# Patient Record
Sex: Male | Born: 1960 | Race: White | Hispanic: No | Marital: Married | Smoking: Current every day smoker
Health system: Southern US, Community
[De-identification: ages and names within clinical notes are randomized; demographics above are authoritative.]

## PROBLEM LIST (undated history)

## (undated) DIAGNOSIS — I10 Essential (primary) hypertension: Secondary | ICD-10-CM

## (undated) DIAGNOSIS — C61 Malignant neoplasm of prostate: Secondary | ICD-10-CM

## (undated) DIAGNOSIS — E785 Hyperlipidemia, unspecified: Secondary | ICD-10-CM

## (undated) HISTORY — DX: Hyperlipidemia, unspecified: E78.5

## (undated) HISTORY — PX: PROSTATE BIOPSY: SHX241

## (undated) HISTORY — DX: Essential (primary) hypertension: I10

## (undated) HISTORY — PX: TONSILLECTOMY: SUR1361

---

## 2016-08-30 ENCOUNTER — Other Ambulatory Visit: Payer: Self-pay | Admitting: Family Medicine

## 2016-08-30 ENCOUNTER — Ambulatory Visit
Admission: RE | Admit: 2016-08-30 | Discharge: 2016-08-30 | Disposition: A | Discharge status: Home / Self Care | Payer: BC Managed Care – PPO | Source: Ambulatory Visit | Attending: Family Medicine | Admitting: Family Medicine

## 2016-08-30 DIAGNOSIS — R519 Headache, unspecified: Secondary | ICD-10-CM

## 2016-08-30 DIAGNOSIS — R51 Headache: Principal | ICD-10-CM

## 2016-08-30 DIAGNOSIS — G4489 Other headache syndrome: Secondary | ICD-10-CM

## 2016-09-05 ENCOUNTER — Encounter: Payer: Self-pay | Admitting: Neurology

## 2016-09-05 ENCOUNTER — Ambulatory Visit (INDEPENDENT_AMBULATORY_CARE_PROVIDER_SITE_OTHER): Payer: BC Managed Care – PPO | Admitting: Neurology

## 2016-09-05 VITALS — BP 136/88 | HR 94 | Ht 70.0 in | Wt 172.0 lb

## 2016-09-05 DIAGNOSIS — G902 Horner's syndrome: Secondary | ICD-10-CM

## 2016-09-05 DIAGNOSIS — R51 Headache: Secondary | ICD-10-CM | POA: Diagnosis not present

## 2016-09-05 DIAGNOSIS — R519 Headache, unspecified: Secondary | ICD-10-CM

## 2016-09-05 DIAGNOSIS — Z8249 Family history of ischemic heart disease and other diseases of the circulatory system: Secondary | ICD-10-CM

## 2016-09-05 DIAGNOSIS — G44009 Cluster headache syndrome, unspecified, not intractable: Secondary | ICD-10-CM

## 2016-09-05 MED ORDER — VERAPAMIL HCL 80 MG PO TABS: 80.0000 mg | ORAL_TABLET | Freq: Three times a day (TID) | ORAL | 2 refills | Status: DC

## 2016-09-05 NOTE — Patient Instructions (Addendum)
1.  We will start verapamil 80mg  three times daily.  Contact me in 2 weeks with update and we can increase dose if needed.  Caution for dizziness or lightheadedness. 2.  Will get MRI of brain and  MRA of head and  Neck 3.  Once I review the imaging, I will prescribe you a medication to take when you get the headache. 4.  Follow up in 3 months but contact me in 2 weeks if headaches are not improved.

## 2016-09-05 NOTE — Progress Notes (Signed)
Chart forwarded.  

## 2016-09-05 NOTE — Progress Notes (Signed)
NEUROLOGY CONSULTATION NOTE  Dan Farrell MRN: [id number] DOB: 1961-04-14  Referring provider: Dr. [name] Primary care provider: Dr. [name]  Reason for consult:  headache  HISTORY OF PRESENT ILLNESS: Dan Farrell is a 55 year old right-handed man with hypertension, hypercholesterolemia, gout and Peyronie's disease who presents for headache.  He is accompanied by his wife who supplements history.  He began having these new headaches 3 1/2 weeks ago.  They are right sided, stabbing, 9/10 intensity and associated with droopy right eyelid, photophobia, and sensation of congestion in his right maxillary sinus.  They usually last around 20 minutes but he may have a right sided pressure headache lasting longer (on a couple of occasions, lasting all day).  It typically wakes him up from sleep between 5 and 7 AM.  He needs to get up, walk around, drink water and massage his head until it resolves.  He denies triggers.  Alcohol does not affect it.  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  Usually goes to bed at 4 AM.  Usually gets 4 hours of sleep a night. Has history of "sinus headaches" over 20 years ago Family history of headache:  Brother had headaches, intracranial aneurysm, stroke and seizures.  CT of head from 08/30/16 showed mild chronic small vessel ischemic changes but otherwise unremarkable.  No evidence of sinusitis. Labs from 08/31/16 include sed rate 4.  PAST MEDICAL HISTORY: Past Medical History:  Diagnosis Date    . Hyperlipemia   . Hypertension     PAST SURGICAL HISTORY: Past Surgical History:  Procedure Laterality Date  . TONSILLECTOMY      MEDICATIONS: No current outpatient prescriptions on file prior to visit.   No current facility-administered medications on file prior to visit.     ALLERGIES: No Known Allergies  FAMILY HISTORY: Family History  Problem Relation Age of Onset  . Seizures Brother   . Aneurysm Brother   . Stroke Brother     SOCIAL HISTORY: Social History   Social History  . Marital status: Unknown    Spouse name: N/A  . Number of children: N/A  . Years of education: N/A   Occupational History  . Not on file.   Social History Main Topics  . Smoking status: Smoker, Current Status Unknown  . Smokeless tobacco: Not on file  . Alcohol use Not on file  . Drug use: Unknown  . Sexual activity: Not on file   Other Topics Concern  . Not on file   Social History Narrative  . No narrative on file    REVIEW OF SYSTEMS: Constitutional: No fevers, chills, or sweats, no generalized fatigue, change in appetite Eyes: No visual changes, double vision, eye pain Ear, nose and throat: No hearing loss, ear pain, nasal congestion, sore throat Cardiovascular: No chest pain, palpitations Respiratory:  No shortness of breath at rest or with exertion, wheezes GastrointestinaI: No nausea, vomiting, diarrhea, abdominal pain, fecal incontinence Genitourinary:  No dysuria, urinary retention or frequency Musculoskeletal:  No neck pain, back pain Integumentary: No rash, pruritus, skin lesions Neurological: as above Psychiatric: No depression, insomnia, anxiety Endocrine: No palpitations,  fatigue, diaphoresis, mood swings, change in appetite, change in weight, increased thirst Hematologic/Lymphatic:  No purpura, petechiae. Allergic/Immunologic: no itchy/runny eyes, nasal congestion, recent allergic reactions, rashes  PHYSICAL EXAM: Vitals:   09/05/16 1430  BP: 136/88   Pulse: 94   General: No acute distress.  Patient appears well-groomed.  Head:  Normocephalic/atraumatic Eyes:  fundi examined but not visualized Neck: supple, no paraspinal tenderness, full range of motion Back: No paraspinal tenderness Heart: regular rate and rhythm Lungs: Clear to auscultation bilaterally. Vascular: No carotid bruits. Neurological Exam: Mental status: alert and oriented to person, place, and time, recent and remote memory intact, fund of knowledge intact, attention and concentration intact, speech fluent and not dysarthric, language intact. Cranial nerves: CN I: not tested CN II: pupil OD 2.5 mm, OS 4 mm, round and reactive to light, visual fields intact CN III, IV, VI:  full range of motion, no nystagmus, right ptosis CN V: facial sensation intact CN VII: upper and lower face symmetric CN VIII: hearing intact CN IX, X: gag intact, uvula midline CN XI: sternocleidomastoid and trapezius muscles intact CN XII: tongue midline Bulk & Tone: normal, no fasciculations. Motor:  5/5 throughout  Sensation: temperature and vibration sensation intact. Deep Tendon Reflexes:  2+ throughout, toes downgoing.  Finger to nose testing:  Without dysmetria.  Heel to shin:  Without dysmetria.  Gait:  Normal station and stride.  Able to turn and tandem walk. Romberg negative.  IMPRESSION: Suspect cluster headaches.  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  However, in new headache in patient over 50, other causes must be ruled out.  PLAN: 1.  MRI of brain and MRA of head and neck to evaluate for intracranial aneurysm (brother had it and required surgery) and possible causes of Horner's syndrome. 2.  Initiate verapamil 80mg  three times daily.  Advised to monitor for lightheadedness. 3.  Once vascular causes are ruled out, will prescribe sumatriptan for abortive therapy. 4.  Follow up in 3 months but he is to contact me in 2 weeks with update and we  can increase dose of verapamil if needed.  Thank you for allowing me to take part in the care of this patient.  Metta Clines, DO  CC:  [name], MD

## 2016-09-19 ENCOUNTER — Ambulatory Visit
Admission: RE | Admit: 2016-09-19 | Discharge: 2016-09-19 | Disposition: A | Discharge status: Home / Self Care | Payer: BC Managed Care – PPO | Source: Ambulatory Visit | Attending: Neurology | Admitting: Neurology

## 2016-09-19 ENCOUNTER — Ambulatory Visit: Payer: BC Managed Care – PPO | Admitting: Neurology

## 2016-09-19 DIAGNOSIS — G902 Horner's syndrome: Secondary | ICD-10-CM

## 2016-09-19 DIAGNOSIS — R51 Headache: Secondary | ICD-10-CM

## 2016-09-19 DIAGNOSIS — Z8249 Family history of ischemic heart disease and other diseases of the circulatory system: Secondary | ICD-10-CM

## 2016-09-19 DIAGNOSIS — R519 Headache, unspecified: Secondary | ICD-10-CM

## 2016-09-19 MED ORDER — GADOBENATE DIMEGLUMINE 529 MG/ML IV SOLN
16.0000 mL | Freq: Once | INTRAVENOUS | Status: AC | PRN
  Administered 2016-09-19: 16 mL via INTRAVENOUS

## 2016-09-20 ENCOUNTER — Telehealth: Payer: Self-pay

## 2016-09-20 DIAGNOSIS — G902 Horner's syndrome: Secondary | ICD-10-CM

## 2016-09-20 NOTE — Telephone Encounter (Signed)
Attempted to reach. Line continuously rang. Will attempt againl ater.

## 2016-09-20 NOTE — Telephone Encounter (Signed)
-----   Message from Pieter Partridge, DO sent at 09/20/2016  9:08 AM EST ----- MRIs look okay.  When he gets a headache attack, he may try sumatriptan.  I recommend the 6mg  injection since it is very effective for these headaches (1 injection at earliest onset of headache an may repeat once after 1 hour if needed).  If he would rather not take an injection, we can prescribe him 100mg  tablet (1 tablet at earliest onset of headache, may repeat after 2 hours if needed).  He should not exceed 2 doses of either medication over 24 hours.

## 2016-09-21 ENCOUNTER — Telehealth: Payer: Self-pay | Admitting: Neurology

## 2016-09-21 MED ORDER — SUMATRIPTAN SUCCINATE 3 MG/0.5ML ~~LOC~~ SOAJ: 3.0000 mg | SUBCUTANEOUS | 3 refills | Status: DC

## 2016-09-21 NOTE — Telephone Encounter (Signed)
Right now, I think the headache is cluster headache.  There isn't always a specific cause for it (some people just develop it, like migraine).  The droopy eyelid is likely a symptom of the headache.  However, since it is constant, one last thing I want to check is MRI of chest to see if there is anything pressing on the nerves that affect the eyelid.

## 2016-09-21 NOTE — Telephone Encounter (Signed)
See previous message

## 2016-09-21 NOTE — Telephone Encounter (Signed)
Dan Farrell 03/29/2061. His # [id number]. He had an MRI on 09/19/16 and he was wondering if the results from the MRI were back. Thank you

## 2016-09-21 NOTE — Telephone Encounter (Signed)
Message relayed to patient. Verbalized understanding. RX was sent to pharmacy, pt okay with injectable. Pt did ask what the next step is to find out whats going on as he is still experiencing pressure behind his eye that is causing his eye lid to droop. Please advise.

## 2016-09-21 NOTE — Telephone Encounter (Signed)
Pt wants to hold off on MRI at the moment. Would like to know if eyelid droop could be helped if verapamil was increased? Could it be due to allergies? Please advise.   Pt aware out of office until Monday.

## 2016-09-23 NOTE — Telephone Encounter (Signed)
I think the eyelid droop is related to the headache.  It is unusual for it to be persistent (usually occurs and resolves with headache), however there are cases where  it may be persistent even when not with headache.  That is why I want to check MRI of chest (to make sure there is no other cause).  I really don't know if increasing the verapamil will fix the droopy eyelid.  It is used to prevent the headache.  If the headache is contro lled on current dose of verapamil, then I wouldn't increase it.  If headaches are not still controlled, then we can increase dose after he has been on current dose for 2 weeks.

## 2016-10-09 ENCOUNTER — Telehealth: Payer: Self-pay | Admitting: Neurology

## 2016-10-09 NOTE — Telephone Encounter (Signed)
Spoke with wife, message relayed, Will dicuss with pt tonight. Will call back if he wishes to pursue.

## 2016-10-09 NOTE — Telephone Encounter (Signed)
Wife called to report that patient's headaches are very well controlled. However he is still suffering from the droopy/runny eye. Would like to know when this will return to normal? Is there a treatment? Please advise.

## 2016-10-09 NOTE — Telephone Encounter (Signed)
Patient wife called and would like [place] to call patient about his eye still drooping. Please call him at [phone]

## 2016-10-09 NOTE — Telephone Encounter (Signed)
Normally the eye droop resolves when headache resolves.  However, there are cases where the droopy eyelid remains even without the headache.  However, to completely rule out other causes of the Horner's Syndrome, I wanted to check MRI of the chest (I am looking for anything that could be pinching the nerves that may cause this).  Otherwise, I can refer him to neuro-ophthalmology (Dr. Jolyn Nap) at [facility].  He specializes in neurologic conditions involving the eye.

## 2016-12-02 ENCOUNTER — Other Ambulatory Visit: Payer: Self-pay | Admitting: Neurology

## 2016-12-02 DIAGNOSIS — G44009 Cluster headache syndrome, unspecified, not intractable: Secondary | ICD-10-CM

## 2016-12-26 ENCOUNTER — Ambulatory Visit (INDEPENDENT_AMBULATORY_CARE_PROVIDER_SITE_OTHER): Payer: BC Managed Care – PPO | Admitting: Neurology

## 2016-12-26 ENCOUNTER — Encounter: Payer: Self-pay | Admitting: Neurology

## 2016-12-26 VITALS — BP 122/62 | HR 71 | Ht 70.0 in | Wt 172.3 lb

## 2016-12-26 DIAGNOSIS — G44009 Cluster headache syndrome, unspecified, not intractable: Secondary | ICD-10-CM

## 2016-12-26 DIAGNOSIS — G902 Horner's syndrome: Secondary | ICD-10-CM

## 2016-12-26 NOTE — Patient Instructions (Signed)
1.  Try stopping the verapamil and see how you do.  If headaches return, restart it and let me know 2.  I just want to check a chest Xray to make sure there isn't anything pressing on the nerve to cause the droopy eyelid. 3.  Follow up in 3 months.

## 2016-12-26 NOTE — Progress Notes (Signed)
NEUROLOGY FOLLOW UP OFFICE NOTE  Dan Farrell [id number]  HISTORY OF PRESENT ILLNESS: Dan Farrell is a 56 year old right-handed man with hypertension, hypercholesterolemia, gout and Peyronie's disease who follows up for cluster headache and right Horner's syndrome.  UPDATE: To assess for Horner's syndrome, MRI of brain with and without contrast and MRA of head and neck were performed on 09/19/16, which were personally reviewed and were normal.  He is taking verapamil 80mg  three times daily.  Headaches resolved completely soon after starting it.  Also, he has had changes at work that is less stressful.  He still has the Horner's syndrome and right side of his nose is congested.  He denies double vision or right arm pain or weakness.  He would like to discuss stopping the verapamil.  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  Usually gets 4 hours of sleep a night.  HISTORY: He began having these new headaches 3 1/2 weeks ago.  They are right sided, stabbing, 9/10 intensity and associated with droopy right eyelid, photophobia, and sensation of congestion in his right maxillary sinus.  They usually last around 20 minutes but he may have a right sided pressure headache lasting longer (on a couple of occasions, lasting all day).  It typically wakes him up from sleep between 5 and 7 AM.  He needs to get up, walk around, drink water and massage his head until it resolves.  He denies triggers.  Alcohol does not affect it.    Has history of "sinus headaches" over 20 years ago Family history of headache:  Brother had headaches, intracranial aneurysm, stroke and seizures.   CT of head from 08/30/16 showed mild chronic small vessel ischemic changes but otherwise unremarkable.  No evidence of sinusitis. Labs from 08/31/16 include sed rate 4.  PAST MEDICAL HISTORY: Past Medical History:  Diagnosis Date  . Hyperlipemia   .  Hypertension     MEDICATIONS: Current Outpatient Prescriptions on File Prior to Visit  Medication Sig Dispense Refill  . fenofibrate 160 MG tablet TAKE 1 TABLET BY MOUTH ONCE A DAY FOR CHOLESTEROL  3  . Multiple Vitamin (MULTIVITAMIN) tablet Take 1 tablet by mouth daily.    . naproxen sodium (ANAPROX) 220 MG tablet Take 220 mg by mouth 2 (two) times daily with a meal.    . SUMAtriptan Succinate (ZEMBRACE SYMTOUCH) 3 MG/0.5ML SOAJ Inject 3 mg into the skin as directed. At earliest onset, may repeat in 2 hours if headache persists or reoccurs 4.5 mL 3  . verapamil (CALAN) 80 MG tablet TAKE 1 TABLET (80 MG TOTAL) BY MOUTH 3 (THREE) TIMES DAILY. 90 tablet 2  . valsartan-hydrochlorothiazide (DIOVAN-HCT) 160-12.5 MG tablet Take 1 tablet by mouth daily.  0   No current facility-administered medications on file prior to visit.     ALLERGIES: No Known Allergies  FAMILY HISTORY: Family History  Problem Relation Age of Onset  . Seizures Brother   . Aneurysm Brother   . Stroke Brother     SOCIAL HISTORY: Social History   Social History  . Marital status: Unknown    Spouse name: N/A  . Number of children: N/A  . Years of education: N/A   Occupational History  . Not on file.   Social History Main Topics  . Smoking status: Smoker, Current Status Unknown  . Smokeless tobacco: Never Used  . Alcohol use Not on file  .  Drug use: Unknown  . Sexual activity: Not on file   Other Topics Concern  . Not on file   Social History Narrative  . No narrative on file    REVIEW OF SYSTEMS: Constitutional: No fevers, chills, or sweats, no generalized fatigue, change in appetite Eyes: No visual changes, double vision, eye pain Ear, nose and throat: No hearing loss, ear pain, nasal congestion, sore throat Cardiovascular: No chest pain, palpitations Respiratory:  No shortness of breath at rest or with exertion, wheezes GastrointestinaI: No nausea, vomiting, diarrhea, abdominal pain, fecal  incontinence Genitourinary:  No dysuria, urinary retention or frequency Musculoskeletal:  No neck pain, back pain Integumentary: No rash, pruritus, skin lesions Neurological: as above Psychiatric: No depression, insomnia, anxiety Endocrine: No palpitations, fatigue, diaphoresis, mood swings, change in appetite, change in weight, increased thirst Hematologic/Lymphatic:  No purpura, petechiae. Allergic/Immunologic: no itchy/runny eyes, nasal congestion, recent allergic reactions, rashes  PHYSICAL EXAM: Vitals:   12/26/16 1428  BP: 122/62  Pulse: 71   General: No acute distress.  Patient appears well-groomed.  normal body habitus. Head:  Normocephalic/atraumatic Eyes:  Fundi examined but not visualized Neck: supple, no paraspinal tenderness, full range of motion Heart:  Regular rate and rhythm Lungs:  Clear to auscultation bilaterally Back: No paraspinal tenderness Neurological Exam: alert and oriented to person, place, and time. Attention span and concentration intact, recent and remote memory intact, fund of knowledge intact.  Speech fluent and not dysarthric, language intact.  Right ptosis.  Pupil OD 2.5 mm, OS 4 mm.  Otherwise, CN II-XII intact. Bulk and tone normal, muscle strength 5/5 throughout.  Sensation to light touch, temperature and vibration intact.  Deep tendon reflexes 2+ throughout, toes downgoing.  Finger to nose and heel to shin testing intact.  Gait normal, Romberg negative.  IMPRESSION: Cluster headache, resolved Persistent right Horner's syndrome.  PLAN: 1.  He will try discontinuing verapamil and see how he feels.  If headache returns, he will restart it. 2.  To complete evaluation of sympathetic chain, will check CXR as well. 3.  Follow up in 3 months.  Metta Clines, DO  CC:  [name], MD

## 2017-04-02 ENCOUNTER — Ambulatory Visit: Payer: BC Managed Care – PPO | Admitting: Neurology

## 2017-04-02 DIAGNOSIS — Z029 Encounter for administrative examinations, unspecified: Secondary | ICD-10-CM

## 2017-04-03 ENCOUNTER — Encounter: Payer: Self-pay | Admitting: Neurology

## 2017-05-19 IMAGING — MR MR HEAD WO/W CM
14 of 17 series · 39 of 48 positions shown · IV contrast (16ml multihance)
Comparison: None.

CLINICAL DATA: Sharp stabbing pain on the right side of the head
for the last 2 months. Family history of aneurysm.

Creatinine was obtained on site at [HOSPITAL] at [HOSPITAL].Results: Creatinine 1.2 mg/dL.
EXAM:
MRI HEAD WITHOUT AND WITH CONTRAST
MRA HEAD WITHOUT CONTRAST
MRA NECK WITHOUT AND WITH CONTRAST
TECHNIQUE: Multiplanar, multiecho pulse sequences of the brain and surrounding
structures were obtained without and with intravenous contrast.
Angiographic images of the Circle of Willis were obtained using MRA
technique without intravenous contrast. Angiographic images of the
neck were obtained using MRA technique without and with intravenous
contrast. Carotid stenosis measurements (when applicable) are
obtained utilizing NASCET criteria, using the distal internal
carotid diameter as the denominator.
CONTRAST:  16mL MULTIHANCE GADOBENATE DIMEGLUMINE 529 MG/ML IV SOLN

[Series 2: T1 · sagittal · 5.0mm · 0.45mm/px · 1 of 21 slices shown]
[im 1/21]
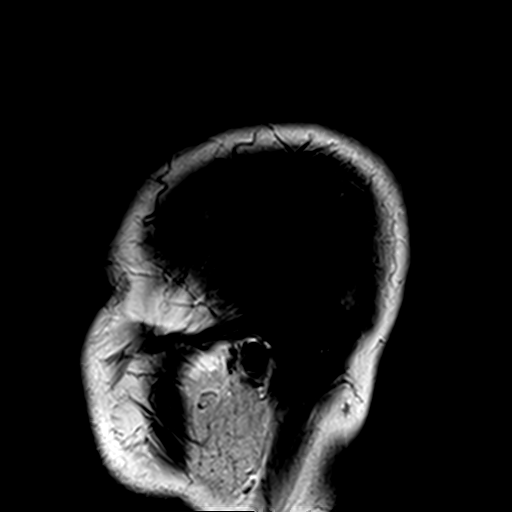

[Series 3: DWI · axial · 3.0mm · 1.80mm/px · z∈[-45,+101]mm · 5 of 100 slices shown (1 of 4)]
[im 1/100]
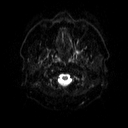
[im 25/100]
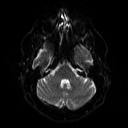
[im 50/100]
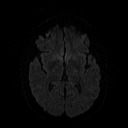
[im 75/100]
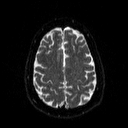
[im 100/100]
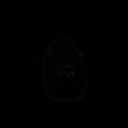

[Series 4: DWI · axial · 3.0mm · 1.80mm/px · z∈[-45,+101]mm · 2 of 50 slices shown (2 of 4)]
[im 1/50]
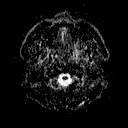
[im 50/50]
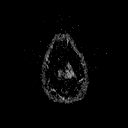

[Series 5: DWI · coronal · 5.0mm · 1.80mm/px · 3 of 66 slices shown (3 of 4)]
[im 1/66]
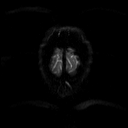
[im 33/66]
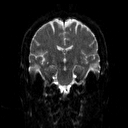
[im 66/66]
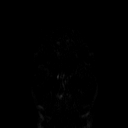

[Series 6: DWI · coronal · 5.0mm · 1.80mm/px · 2 of 34 slices shown (4 of 4)]
[im 1/34]
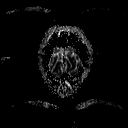
[im 34/34]
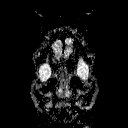

[Series 8: mip_images(sw) · axial · 16.0mm · 0.90mm/px · z∈[-45,+98]mm · 4 of 73 slices shown]
[im 1/73]
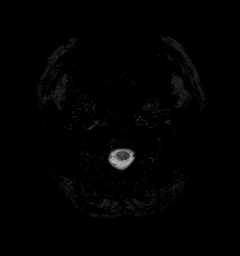
[im 25/73]
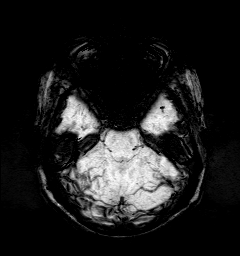
[im 49/73]
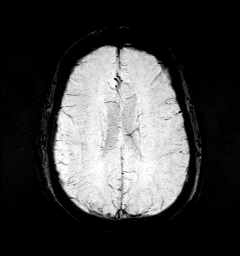
[im 73/73]
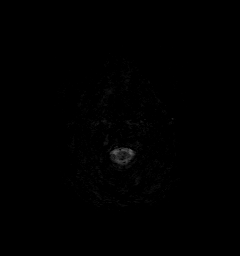

[Series 9: swi_images · axial · 2.0mm · 0.90mm/px · z∈[-52,+105]mm · 4 of 80 slices shown]
[im 1/80]
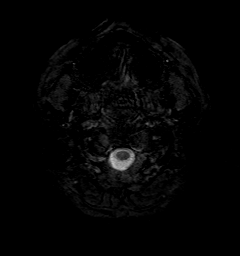
[im 27/80]
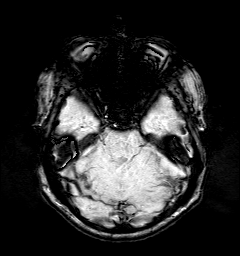
[im 53/80]
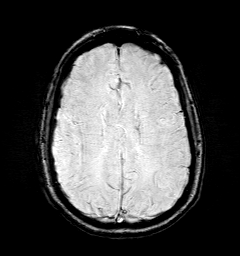
[im 80/80]
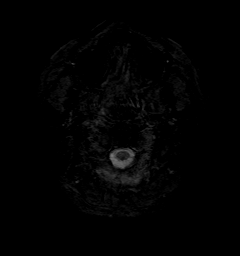

[Series 10: tof_3d_multi-slab · axial · 0.7mm · 0.35mm/px · z∈[-34,+59]mm · 7 of 136 slices shown]
[im 1/136]
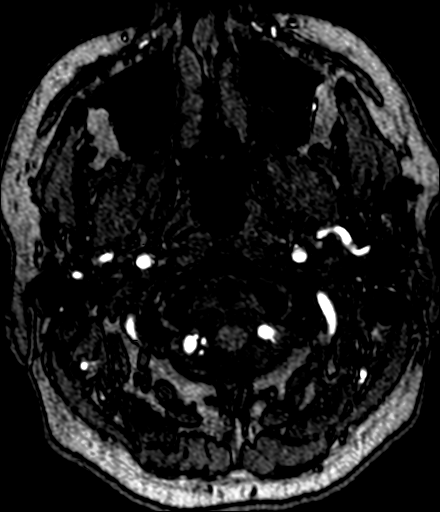
[im 23/136]
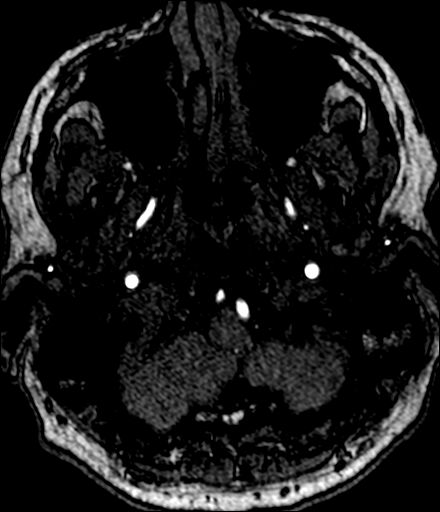
[im 46/136]
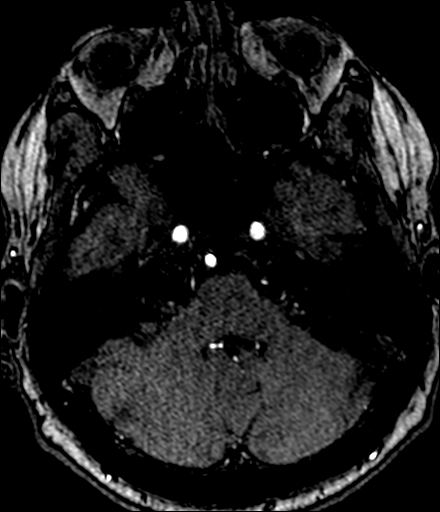
[im 68/136]
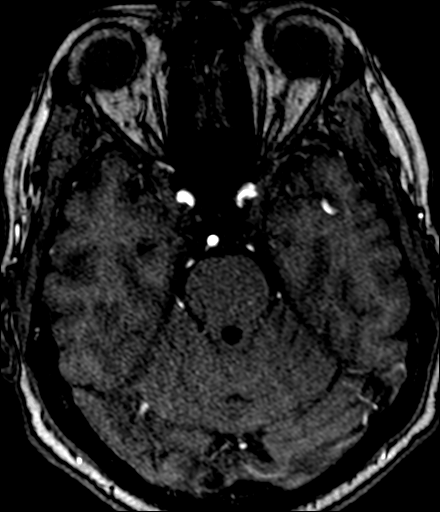
[im 91/136]
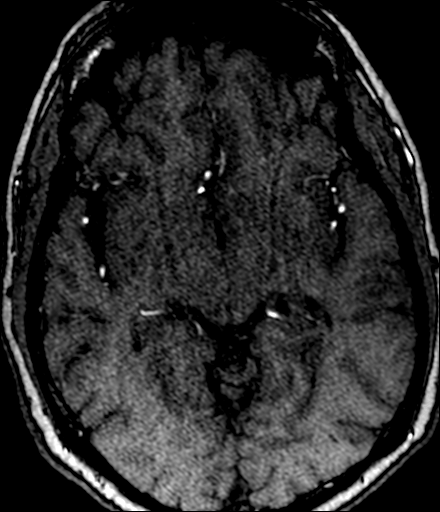
[im 113/136]
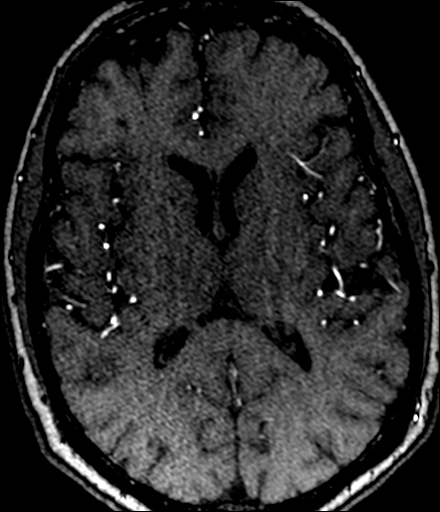
[im 136/136]
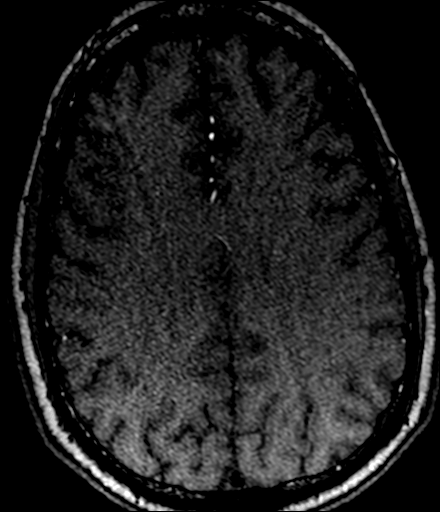

[Series 14: T2 · axial · 5.0mm · 0.51mm/px · 1 of 22 slices shown (1 of 2)]
[im 1/22]
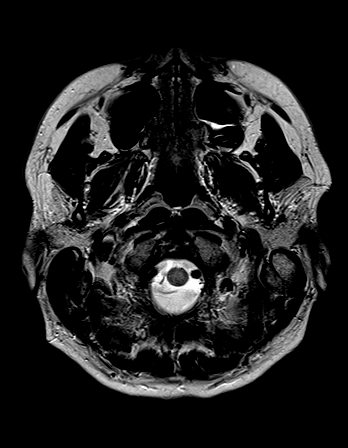

[Series 15: FLAIR · axial · 5.0mm · 0.45mm/px · 1 of 22 slices shown]
[im 1/22]
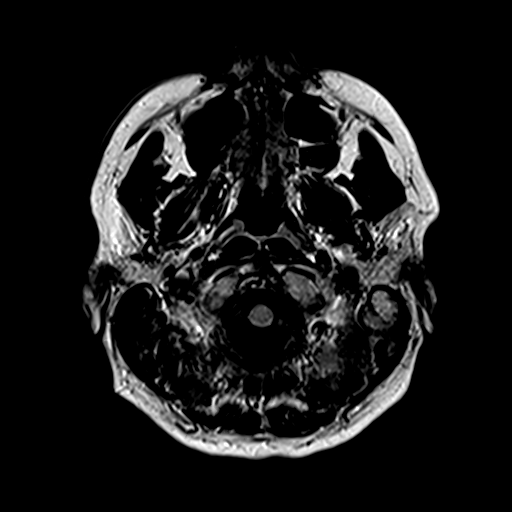

[Series 16: t1_mpr_tra · axial · 2.0mm · 0.45mm/px · z∈[-51,+107]mm · 4 of 80 slices shown]
[im 1/80]
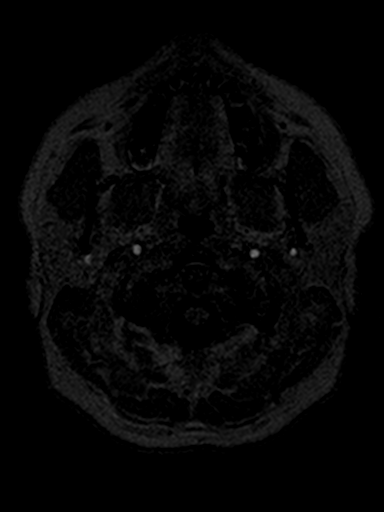
[im 27/80]
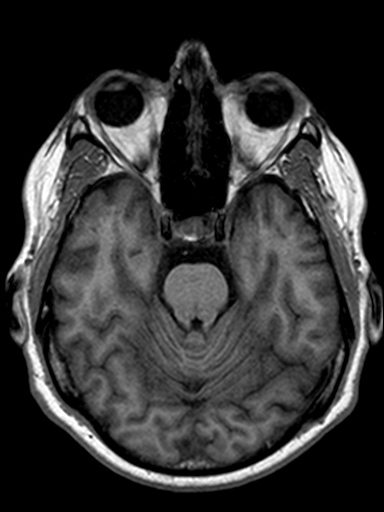
[im 53/80]
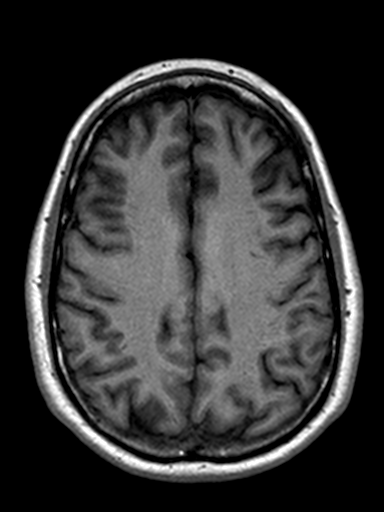
[im 80/80]
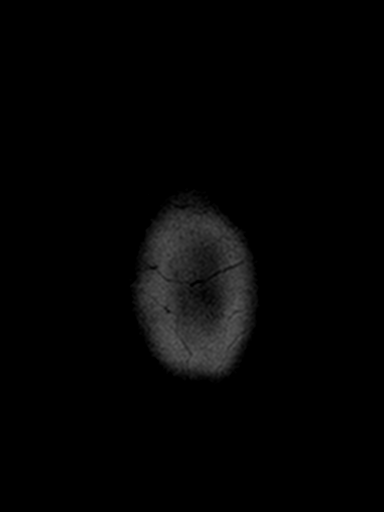

[Series 20: fl_tof_2d · axial · 3.0mm · 0.39mm/px · z∈[-171,-73]mm · 2 of 50 slices shown]
[im 1/50]
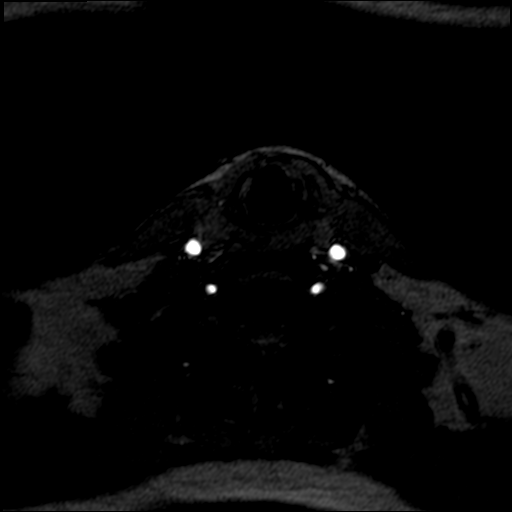
[im 50/50]
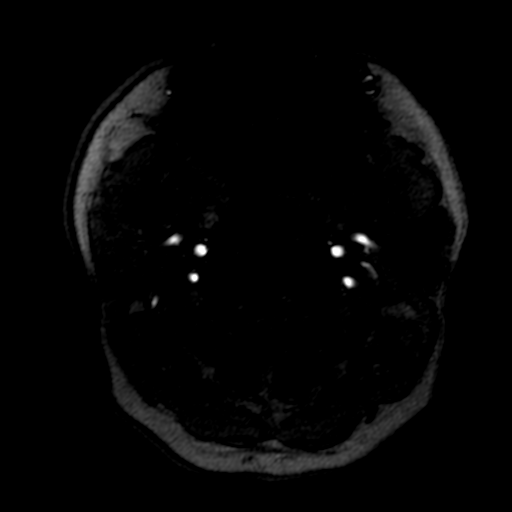

[Series 26: (id)_tt=1.0s · coronal · 0.8mm · 0.78mm/px · 2 of 70 slices shown]
[im 1/70]
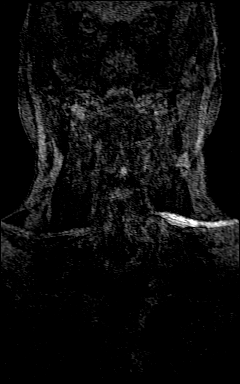
[im 35/70]
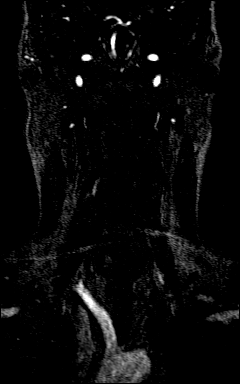

[Series 29: T2 · coronal · 5.0mm · 0.45mm/px · 1 of 25 slices shown (2 of 2)]
[im 1/25]
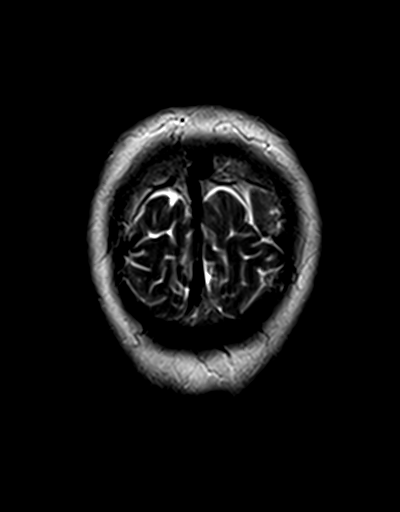

[39 of 48 positions shown; findings below may reference images not displayed]

FINDINGS: MRI HEAD FINDINGS

Brain: The brain has normal appearance without evidence of
malformation, atrophy, old or acute small or large vessel
infarction, hemorrhage, hydrocephalus or extra-axial collection. No
pituitary abnormality. After contrast administration, no abnormal
enhancement occurs.

Vascular: Negative.  See below.

Skull and upper cervical spine: Normal

Sinuses/Orbits: Clear/normal

Other: None significant.

MRA HEAD FINDINGS

Both internal carotid arteries are widely patent into the brain. The
anterior and middle cerebral vessels are normal without proximal
stenosis, aneurysm or vascular malformation. Incidental fenestration
of the A1 segment on the left, not significant. Both vertebral
arteries are widely patent to the basilar. No basilar stenosis.
Posterior circulation branch vessels are normal.

MRA NECK FINDINGS

Branching pattern of the brachiocephalic vessels from the arch is
normal. No origin stenoses. Both common carotid arteries are widely
patent to their respective bifurcation. Both carotid bifurcations
are normal. Both cervical internal carotid arteries are normal. Both
vertebral artery origins are widely patent. Both vertebral arteries
appear normal through the cervical region.
IMPRESSION: Normal MRI of the brain.

Normal MR angiography of the neck vessels.

Normal intracranial MR angiography. No abnormality seen to explain
the patient's symptoms. No aneurysm.

## 2018-03-24 DIAGNOSIS — C61 Malignant neoplasm of prostate: Secondary | ICD-10-CM

## 2018-03-24 HISTORY — DX: Malignant neoplasm of prostate: C61

## 2018-04-01 ENCOUNTER — Other Ambulatory Visit: Payer: Self-pay | Admitting: Urology

## 2018-04-01 DIAGNOSIS — C61 Malignant neoplasm of prostate: Secondary | ICD-10-CM

## 2018-04-02 ENCOUNTER — Other Ambulatory Visit: Payer: Self-pay | Admitting: Urology

## 2018-04-14 ENCOUNTER — Encounter (HOSPITAL_COMMUNITY)
Admission: RE | Admit: 2018-04-14 | Discharge: 2018-04-14 | Disposition: A | Discharge status: Home / Self Care | Payer: BC Managed Care – PPO | Source: Ambulatory Visit | Attending: Urology | Admitting: Urology

## 2018-04-14 DIAGNOSIS — C7951 Secondary malignant neoplasm of bone: Secondary | ICD-10-CM | POA: Insufficient documentation

## 2018-04-14 DIAGNOSIS — C61 Malignant neoplasm of prostate: Secondary | ICD-10-CM | POA: Diagnosis present

## 2018-04-14 MED ORDER — TECHNETIUM TC 99M MEDRONATE IV KIT
20.0000 | PACK | Freq: Once | INTRAVENOUS | Status: AC | PRN
  Administered 2018-04-14: 21.2 via INTRAVENOUS

## 2018-04-22 ENCOUNTER — Encounter: Payer: Self-pay | Admitting: Oncology

## 2018-04-22 ENCOUNTER — Telehealth: Payer: Self-pay | Admitting: Oncology

## 2018-04-22 NOTE — Telephone Encounter (Signed)
New referral received from Dr. Louis Meckel at [facility], dx: metastatic prostate cancer. Pt has been scheduled to see Dr. Alen Blew on 6/20 at 11am. Pt aware to arrive 30 minutes early. Letter mailed.

## 2018-05-01 ENCOUNTER — Telehealth: Payer: Self-pay | Admitting: Pharmacist

## 2018-05-01 ENCOUNTER — Telehealth: Payer: Self-pay | Admitting: Pharmacy Technician

## 2018-05-01 ENCOUNTER — Telehealth: Payer: Self-pay | Admitting: Oncology

## 2018-05-01 ENCOUNTER — Inpatient Hospital Stay: Payer: BC Managed Care – PPO | Attending: Oncology | Admitting: Oncology

## 2018-05-01 ENCOUNTER — Encounter: Payer: Self-pay | Admitting: Medical Oncology

## 2018-05-01 VITALS — BP 131/83 | HR 68 | Temp 98.0°F | Resp 18 | Ht 70.0 in | Wt 171.2 lb

## 2018-05-01 DIAGNOSIS — C61 Malignant neoplasm of prostate: Secondary | ICD-10-CM | POA: Diagnosis not present

## 2018-05-01 MED ORDER — ABIRATERONE ACETATE 250 MG PO TABS: 1000.0000 mg | ORAL_TABLET | Freq: Every day | ORAL | 0 refills | Status: DC

## 2018-05-01 MED ORDER — PREDNISONE 5 MG PO TABS: 5.0000 mg | ORAL_TABLET | Freq: Every day | ORAL | 3 refills | Status: DC

## 2018-05-01 NOTE — Progress Notes (Signed)
Reason for Referral: Prostate cancer  HPI: 57 year old man currently of [place] where he lived the majority of his life.  He is a rather healthy man with history of hypertension and hyperlipidemia was found to have an elevated PSA in September 2018 at that time his PSA was 5.59.  A repeat PSA in March 2019 was up to 11.0.  He subsequently evaluated by Dr. Louis Meckel and a biopsy of his prostate obtained on Mar 24, 2018.  At that time he was noted to have a Gleason score 4+3 equal 7 and 5 cores with one core of 4+4 = 8 all predominantly on the left side of the gland.  Based in these findings he underwent staging work-up including a bone scan obtained on 04/14/2018 which showed evidence of osseous metastatic disease in the left inferior pubic ramus as well as the left femur lesser trochanter and to posterior ribs that are highly suspicious for bony metastasis.  CT scan of the abdomen and pelvis showed a 10 mm sclerotic lesion in the left ischial tuberosity with small left iliac lymph node measuring less than 1 cm suspicious for bony metastatic disease.  Based on these findings, he was referred to me for evaluation.  Clinically, he reports no symptoms related to these findings.  He denies any lower urinary tract symptoms including frequency, urgency or hesitancy.  He denies any erectile dysfunction or bone pain.  He denies any pathological fractures or discomfort.  He denies any excessive fatigue or tiredness.  He remains active and continues to work full-time.   does not report any headaches, blurry vision, syncope or seizures. Does not report any fevers, chills or sweats.  Does not report any cough, wheezing or hemoptysis.  Does not report any chest pain, palpitation, orthopnea or leg edema.  Does not report any nausea, vomiting or abdominal pain.  Does not report any constipation or diarrhea.  Does not report any skeletal complaints.    Does not report frequency, urgency or hematuria.  Does not report any skin  rashes or lesions. Does not report any heat or cold intolerance.  Does not report any lymphadenopathy or petechiae.  Does not report any anxiety or depression.  Remaining review of systems is negative.    Past Medical History:  Diagnosis Date  . Hyperlipemia   . Hypertension   :  Past Surgical History:  Procedure Laterality Date  . TONSILLECTOMY    :   Current Outpatient Medications:  .  fenofibrate 160 MG tablet, TAKE 1 TABLET BY MOUTH ONCE A DAY FOR CHOLESTEROL, Disp: , Rfl: 3 .  Multiple Vitamin (MULTIVITAMIN) tablet, Take 1 tablet by mouth daily., Disp: , Rfl:  .  naproxen sodium (ANAPROX) 220 MG tablet, Take 220 mg by mouth 2 (two) times daily with a meal., Disp: , Rfl:  .  SUMAtriptan Succinate (ZEMBRACE SYMTOUCH) 3 MG/0.5ML SOAJ, Inject 3 mg into the skin as directed. At earliest onset, may repeat in 2 hours if headache persists or reoccurs, Disp: 4.5 mL, Rfl: 3 .  valsartan-hydrochlorothiazide (DIOVAN-HCT) 160-12.5 MG tablet, Take 1 tablet by mouth daily., Disp: , Rfl: 0 .  abiraterone acetate (ZYTIGA) 250 MG tablet, Take 4 tablets (1,000 mg total) by mouth daily. Take on an empty stomach 1 hour before or 2 hours after a meal, Disp: 120 tablet, Rfl: 0 .  predniSONE (DELTASONE) 5 MG tablet, Take 1 tablet (5 mg total) by mouth daily with breakfast., Disp: 90 tablet, Rfl: 3:  No Known Allergies:  Family History  Problem Relation Age of Onset  . Seizures Brother   . Aneurysm Brother   . Stroke Brother   :  Social History   Socioeconomic History  . Marital status: Married    Spouse name: Not on file  . Number of children: Not on file  . Years of education: Not on file  . Highest education level: Not on file  Occupational History  . Not on file  Social Needs  . Financial resource strain: Not on file  . Food insecurity:    Worry: Not on file    Inability: Not on file  . Transportation needs:    Medical: Not on file    Non-medical: Not on file  Tobacco Use  .  Smoking status: Smoker, Current Status Unknown  . Smokeless tobacco: Never Used  Substance and Sexual Activity  . Alcohol use: Not on file  . Drug use: Not on file  . Sexual activity: Not on file  Lifestyle  . Physical activity:    Days per week: Not on file    Minutes per session: Not on file  . Stress: Not on file  Relationships  . Social connections:    Talks on phone: Not on file    Gets together: Not on file    Attends religious service: Not on file    Active member of club or organization: Not on file    Attends meetings of clubs or organizations: Not on file    Relationship status: Not on file  . Intimate partner violence:    Fear of current or ex partner: Not on file    Emotionally abused: Not on file    Physically abused: Not on file    Forced sexual activity: Not on file  Other Topics Concern  . Not on file  Social History Narrative  . Not on file  :  Pertinent items are noted in HPI.  Exam: Blood pressure 131/83, pulse 68, temperature 98 F (36.7 C), temperature source Oral, resp. rate 18, height 5\' 10"  (1.778 m), weight 171 lb 3.2 oz (77.7 kg), SpO2 98 %.  ECOG 0 General appearance: alert and cooperative appeared without distress. Head: atraumatic without any abnormalities. Eyes: conjunctivae/corneas clear. PERRL.  Sclera anicteric. Throat: lips, mucosa, and tongue normal; without oral thrush or ulcers. Resp: clear to auscultation bilaterally without rhonchi, wheezes or dullness to percussion. Cardio: regular rate and rhythm, S1, S2 normal, no murmur, click, rub or gallop GI: soft, non-tender; bowel sounds normal; no masses,  no organomegaly Skin: Skin color, texture, turgor normal. No rashes or lesions Lymph nodes: Cervical, supraclavicular, and axillary nodes normal. Neurologic: Grossly normal without any motor, sensory or deep tendon reflexes. Musculoskeletal: No joint deformity or effusion.     Nm Bone Scan Whole Body  Result Date:  04/14/2018 CLINICAL DATA:  57 year old male with prostate cancer. Aching pain in the pelvis. No recent trauma. EXAM: NUCLEAR MEDICINE WHOLE BODY BONE SCAN TECHNIQUE: Whole body anterior and posterior images were obtained approximately 3 hours after intravenous injection of radiopharmaceutical. RADIOPHARMACEUTICALS:  21.2 mCi Technetium-12m MDP IV COMPARISON:  Pelvis CT today reported separately. FINDINGS: Radiotracer activity is visible in both kidneys and the urinary bladder. There is asymmetric degenerative appearing radiotracer activity in the shoulders (greater on the right), and hands (greater on the left). However, there are also multiple indeterminate and suspicious foci of radiotracer activity, most notably: - left inferior pubic ramus, which corresponds to the suspicious 10 millimeter sclerotic focus demonstrated by CT today. -  left proximal femur lesser trochanter, where there is perhaps subtle asymmetric sclerosis on the CT comparison today (coronal series 601, image 102 of that exam). - abnormally increased longitudinal activity along two posterior ribs, on the left side approximately rib 8 and on the right side ribs 7 7, also highly suspicious. - small focus in the left lateral 3rd rib or scapula, indeterminate. - right paracentral upper thoracic spine (approximately T6) or sternum focus, indeterminate. IMPRESSION: 1. Positive for evidence of osseous metastatic disease: - small but fairly intense foci of radiotracer activity in the left inferior pubic ramus (corresponding to the suspicious sclerotic lesion on the pelvis CT today), the left femur lesser trochanter, and 2 posterior ribs are highly suspicious for bone metastases. 2. Small more indeterminate lesions in the right upper thoracic vertebra, approximately T6), and left scapula or lateral 3rd rib. Electronically Signed   By: Genevie Ann M.D.   On: 04/14/2018 13:55    Assessment and Plan:   57 year old man with the following issues:  1.   Prostate cancer diagnosed in May 2019.  He presented with a PSA of 11 and prostate biopsy showed a Gleason score 4+4 = 8 and one core and Gleason score 4+3 equal 7 and 5 other cores in the left prostate gland.  Staging work-up was suggestive of metastatic disease including uptake in the left inferior pubic ramus as well as the left femur lesser trochanter.  2 posterior ribs were also suspicious in addition of a small left iliac lymph node.  The natural course of this disease was reviewed today with the patient and his wife.  He appears to have advanced disease although his disease volume is low he is certainly has high risk prostate cancer given his Gleason score 8.  Treatment options were reviewed today and will certainly include systemic therapy.  Androgen deprivation therapy is the backbone of treating advanced prostate cancer and I think it is paramount in his case.  Complication associated with androgen deprivation including hot flashes, weight gain, erectile dysfunction, increased risk for cardiovascular disease, and others.  He is agreeable to proceed with this treatment in the immediate future.  The role for additional therapy to androgen deprivation was reviewed today.  He will be an excellent candidate to consider that given his young age, high risk disease and low volume on his imaging.  In this setting, using Zytiga and prednisone would be optimal.  Complication associated with this medication including hypertension, fatigue, edema and hypokalemia were reviewed.  The benefit would be improving his overall survival as well as metastatic free survival associated with additional therapy to androgen deprivation.  I see limited role for Taxotere chemotherapy at this time but certainly can be implemented in the future.  Finally we have discussed the role for definitive therapy to the primary in the setting of oligometastatic disease.  Given his young age and limited metastatic disease sites, I think is  reasonable to consider definitive therapy for his primary tumor and treating his bony metastasis with radiation.  He understands the chance of cure is very low but would be reasonable consideration at this time.  I will refer him to radiation oncology for an opinion regarding this issue.  2.  Androgen deprivation: This will start in the form of Lupron in the next 2 weeks.  3.  Bone directed therapy: He would be a candidate for Xgeva in the future after obtaining dental clearance.  I will address that with him in future visits.  4.  Follow-up: We will be in the next 4 to 5 weeks to follow his progress.  60  minutes was spent with the patient face-to-face today.  More than 50% of time was dedicated to patient counseling, education and coordination of his care.

## 2018-05-01 NOTE — Telephone Encounter (Signed)
Appointments scheduled AVS/Calendar printed per 6/20 los °

## 2018-05-01 NOTE — Telephone Encounter (Signed)
Oral Chemotherapy Pharmacist Encounter   I spoke with patient for overview of: Zytiga.   Counseled patient on administration, dosing, side effects, monitoring, drug-food interactions, safe handling, storage, and disposal.  Patient will take Zytiga 250mg  tablets, 4 tablets (1000mg ) by mouth once daily on an empty stomach, 1 hour before or 2 hours after a meal.  Patient states he will take his Zyitga 1st thing in the morning and will wait at least 1 hour before eating.  Patient will take prednisone 5mg  tablet, 1 tablet by mouth one daily with breakfast.  Zytiga start date: 05/19/18  Adverse effects include but are not limited to: peripheral edema, GI upset, hypertension, hot flashes, fatigue, and arthralgias.    Prednisone prescription has been sent to [facility] in [place]. Patient will obtain prednisone and knows to start prednisone on the same day as Zytiga start.  Reviewed with patient importance of keeping a medication schedule and plan for any missed doses.  Mr. Kluth voiced understanding and appreciation.   All questions answered. Medication reconciliation performed and medication/allergy list updated.  Patient updated that [facility] will likely be mandated dispensing pharmacy per insurance requirement. I provided phone number to [facility] [phone]), with instructions to call them on Wednesday 05/07/18 if he had not yet heard from them to schedule delivery of Zytiga.  Patient informed generic version abiraterone acetate is preferred agent per his insurance.  I conformed Lupron start date injection appointment of 05/19/18 with patient. OVs on 06/09/18 also confirmed with patient.  Patient knows to call the office with questions or concerns.  Oral Oncology Clinic will continue to follow.  Thank you,  Johny Drilling, PharmD, BCPS, BCOP  05/01/2018 2:23 PM Oral Oncology Clinic [phone]

## 2018-05-01 NOTE — Telephone Encounter (Signed)
Oral Oncology Patient Advocate Encounter  Received notification from [facility] that prior authorization for Dan Farrell is required.  PA submitted on CoverMyMeds Key [id number] Status is pending  Oral Oncology Clinic will continue to follow.  Dan Farrell. Melynda Keller, [name] [phone] 05/01/2018 1:21 PM

## 2018-05-01 NOTE — Progress Notes (Signed)
Introduced myself to Dan Farrell and his wife as the prostate nurse navigator and my role. He has no family history of prostate cancer. He was planning to have robotic prostatectomy but his bone scan revealed mets. He is here today to discuss treatment options with Dr. Alen Blew. They are feeling overwhelmed and his wife is tearful. I informed them of the prostate support group here at [facility] and our other support services.I gave them my business card and asked them to call me with questions and/or concerns. Zytiga patient information was given to patient.

## 2018-05-01 NOTE — Telephone Encounter (Signed)
Oral Oncology Pharmacist Encounter  Received new prescription for Zytiga (abiraterone) for the treatment of newly-diagnosed, metastatic, castration-sensitive prostate cancer in conjunction with prednisone and androgen deprivation therapy, planned duration until disease progression or unacceptable toxicity.  No recent labs in Epic to assess, CBC and CMET on order for 06/09/18 OV BPs reviewed, WNL, pt noted to be on anti-hypertensive medication, will continue to be monitored  Current medication list in Epic reviewed, no DDIs with Zytiga identified.  Zytiga prescription will be sent to appropriate specialty pharmacy once insurance authorization is obtained. This has been submitted. [facility] [place]  Oral Oncology Clinic will continue to follow for insurance authorization, copayment issues, initial counseling and start date.  Johny Drilling, PharmD, BCPS, BCOP  05/01/2018 1:20 PM Oral Oncology Clinic [phone]

## 2018-05-02 ENCOUNTER — Encounter: Payer: Self-pay | Admitting: Radiation Oncology

## 2018-05-02 MED ORDER — ABIRATERONE ACETATE 250 MG PO TABS: 1000.0000 mg | ORAL_TABLET | Freq: Every day | ORAL | 0 refills | Status: DC

## 2018-05-02 NOTE — Telephone Encounter (Signed)
Oral Oncology Pharmacist Encounter  Received notification that insurance authorization for abiraterone acetate has been approved. Prescription must filled through [facility] per insurance requirement.  Prescription has been e-scribed to the [facility] in [place], [place]. Patient has previously been given [facility] contact information.  I called and updated patient on insurance authorization, dispensing pharmacy, and planned start date of after vacation.  Oral Oncology Clinic will continue to follow.  Johny Drilling, PharmD, BCPS, BCOP  05/02/2018 8:55 AM Oral Oncology Clinic [phone]

## 2018-05-02 NOTE — Telephone Encounter (Signed)
Oral Oncology Patient Advocate Encounter  Prior Authorization for Dan Farrell has been approved.    PA# [id number] Effective dates: 05/01/2018 through 05/01/2020  Oral Oncology Clinic will continue to follow.   Dan Farrell. Melynda Keller, [place] Patient [place] [phone] 05/02/2018 8:03 AM

## 2018-05-12 ENCOUNTER — Telehealth: Payer: Self-pay | Admitting: *Deleted

## 2018-05-12 NOTE — Telephone Encounter (Signed)
patient calling to ask if okay to take viagra week before starting lupron?

## 2018-05-16 ENCOUNTER — Ambulatory Visit: Admit: 2018-05-16 | Payer: BC Managed Care – PPO | Admitting: Urology

## 2018-05-16 SURGERY — PROSTATECTOMY, RADICAL, ROBOT-ASSISTED, LAPAROSCOPIC
Anesthesia: General

## 2018-05-16 NOTE — Telephone Encounter (Signed)
Oral Oncology Pharmacist Encounter  I [facility] [phone] to follow-up on status of patient's Zytiga (abiraterone) prescription. Per representative, first abiraterone fill shift on 05/12/2018 for delivery to patient's home on 05/13/2018  Johny Drilling, PharmD, BCPS, BCOP  05/16/2018 2:50 PM Oral Oncology Clinic [phone]

## 2018-05-19 ENCOUNTER — Inpatient Hospital Stay: Payer: BC Managed Care – PPO | Attending: Oncology

## 2018-05-19 DIAGNOSIS — Z5111 Encounter for antineoplastic chemotherapy: Secondary | ICD-10-CM | POA: Insufficient documentation

## 2018-05-19 DIAGNOSIS — C7951 Secondary malignant neoplasm of bone: Secondary | ICD-10-CM | POA: Diagnosis not present

## 2018-05-19 DIAGNOSIS — E876 Hypokalemia: Secondary | ICD-10-CM | POA: Insufficient documentation

## 2018-05-19 DIAGNOSIS — Z79899 Other long term (current) drug therapy: Secondary | ICD-10-CM | POA: Insufficient documentation

## 2018-05-19 DIAGNOSIS — C61 Malignant neoplasm of prostate: Secondary | ICD-10-CM | POA: Diagnosis present

## 2018-05-19 DIAGNOSIS — I10 Essential (primary) hypertension: Secondary | ICD-10-CM | POA: Diagnosis not present

## 2018-05-19 MED ORDER — LEUPROLIDE ACETATE (4 MONTH) 30 MG IM KIT
30.0000 mg | PACK | Freq: Once | INTRAMUSCULAR | Status: AC
  Administered 2018-05-19: 30 mg via INTRAMUSCULAR
  Filled 2018-05-19: qty 30

## 2018-05-19 NOTE — Patient Instructions (Signed)

## 2018-05-26 ENCOUNTER — Telehealth: Payer: Self-pay | Admitting: Oncology

## 2018-05-26 NOTE — Telephone Encounter (Signed)
Faxed medical records to [facility] on 05/26/18, Release ID: [id number]

## 2018-05-27 ENCOUNTER — Encounter: Payer: Self-pay | Admitting: Radiation Oncology

## 2018-05-27 NOTE — Progress Notes (Signed)
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  Sites of Visceral and Bony Metastatic Disease: left inferior pubic ramus, left femur lesser trochanter, posterior ribs, left ischial tuberosity with small left iliac lymph node  Location(s) of Symptomatic Metastases: Left shoulder  Past/Anticipated chemotherapy by medical oncology, if any: planning for androgen deprivation therapy, using zytiga and prednisone optional, Xgeva possibly in the future, referral to radiation therapy for consideration of radiation therapy to bony mets   Pain on a scale of 0-10 is:No   If Spine Met(s), symptoms, if any, include:  Bowel/Bladder retention or incontinence (please describe): No urinary issues or bowel issues  Numbness or weakness in extremities (please describe): No issues  Current Decadron regimen, if applicable: Prednisone 5 mg daily no evidence of thrush  Ambulatory status? Walker? Wheelchair?: Ambulatory  SAFETY ISSUES:  Prior radiation? No  Pacemaker/ICD? No  Possible current pregnancy? no  Is the patient on methotrexate? No  Current Complaints / other details:  57 year old male. Married with one son and one daughter. Works full time. Current everyday smoker. NKDA. Wt Readings from Last 3 Encounters:  05/29/18 173 lb 4.8 oz (78.6 kg)  05/01/18 171 lb 3.2 oz (77.7 kg)  12/26/16 172 lb 4.8 oz (78.2 kg)  BP 134/86 (BP Location: Left Arm, Patient Position: Sitting, Cuff Size: Normal)   Pulse 75   Temp 98.2 F (36.8 C) (Oral)   Resp 18   Ht _0  (1.778 m)   Wt 173 lb 4.8 oz (78.6 kg)   SpO2 100%   BMI 24.87 kg/m

## 2018-05-29 ENCOUNTER — Ambulatory Visit
Admission: RE | Admit: 2018-05-29 | Discharge: 2018-05-29 | Disposition: A | Discharge status: Home / Self Care | Payer: BC Managed Care – PPO | Source: Ambulatory Visit | Attending: Radiation Oncology | Admitting: Radiation Oncology

## 2018-05-29 ENCOUNTER — Encounter: Payer: Self-pay | Admitting: Medical Oncology

## 2018-05-29 ENCOUNTER — Encounter: Payer: Self-pay | Admitting: Radiation Oncology

## 2018-05-29 ENCOUNTER — Other Ambulatory Visit: Payer: Self-pay

## 2018-05-29 ENCOUNTER — Encounter

## 2018-05-29 VITALS — BP 134/86 | HR 75 | Temp 98.2°F | Resp 18 | Ht 70.0 in | Wt 173.3 lb

## 2018-05-29 DIAGNOSIS — Z79899 Other long term (current) drug therapy: Secondary | ICD-10-CM | POA: Insufficient documentation

## 2018-05-29 DIAGNOSIS — C61 Malignant neoplasm of prostate: Secondary | ICD-10-CM | POA: Diagnosis present

## 2018-05-29 DIAGNOSIS — E785 Hyperlipidemia, unspecified: Secondary | ICD-10-CM | POA: Diagnosis not present

## 2018-05-29 DIAGNOSIS — C7951 Secondary malignant neoplasm of bone: Secondary | ICD-10-CM | POA: Diagnosis not present

## 2018-05-29 DIAGNOSIS — F17210 Nicotine dependence, cigarettes, uncomplicated: Secondary | ICD-10-CM | POA: Insufficient documentation

## 2018-05-29 DIAGNOSIS — I10 Essential (primary) hypertension: Secondary | ICD-10-CM | POA: Diagnosis not present

## 2018-05-29 DIAGNOSIS — M25512 Pain in left shoulder: Secondary | ICD-10-CM | POA: Diagnosis not present

## 2018-05-29 DIAGNOSIS — G8929 Other chronic pain: Secondary | ICD-10-CM | POA: Insufficient documentation

## 2018-05-29 DIAGNOSIS — Z7952 Long term (current) use of systemic steroids: Secondary | ICD-10-CM | POA: Diagnosis not present

## 2018-05-29 HISTORY — DX: Malignant neoplasm of prostate: C61

## 2018-05-29 NOTE — Progress Notes (Signed)
Dan Farrell here today to discuss radiation for his bone mets. He has started Zytiga since seeing Dr. Alen Blew and he states he has done well without any side effects. He has fatigue and some shoulder pain but he states this is from working long hours and not his cancer or treatment. He and has his wife are interested in attending the prostate support group. I informed them the next meeting will be August 19.

## 2018-05-29 NOTE — Progress Notes (Signed)
Radiation Oncology         [phone]) [phone] ________________________________  Initial outpatient Consultation  Name: Dan Farrell MRN: [id number]  Date: 05/29/2018  DOB: 08/14/1961  CC:Dan Arabian, MD  Dan Portela, MD   REFERRING PHYSICIAN: Wyatt Portela, MD  DIAGNOSIS: 57 y.o. 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    ICD-10-CM   1. Prostate cancer [facility]) C61     HISTORY OF PRESENT ILLNESS: Dan Farrell is a 57 y.o. male with a diagnosis of prostate cancer. He was noted to have an elevated PSA of 6.31 in 02/2017 by his primary care physician, Dr. [name], MD.  Accordingly, he was referred for evaluation in urology by Dr. [name] Herrick,MD in 04/2017,  digital rectal examination was performed at that time revealing no nodules. A repeat PSA was performed in 07/2017 and remained elevated at 5.59.  A prostate biopsy was recommended at that time but the patient wished to wait and repeat a PSA prior to proceeding.  Repeat PSA in 10/2017 was 7.19 and in 01/2018 was 11.0.  The patient proceeded to transrectal ultrasound with 12 biopsies of the prostate on 03/24/2018.  The prostate volume measured 23 grams.  Out of 12 core biopsies, all 6 samples on the left were positive.  The maximum Gleason score was 4+4=8, and this was seen in left apex. Gleason 4+3 was seen in the other 5 samples on the left.    He underwent CT Pelvis and Bone scan imaging on 04/14/18 for disease staging. CT Pelvis revealed 2 suspicious left iliac nodes. Slightly smaller than 1 cm and a solitary 10 mm sclerotic lesion in the left ischial tuberosity.  Bone Scan was positive for evidence of osseous metastatic disease with small but fairly intense foci of radiotracer activity in the left inferior pubic ramus (corresponding to the suspicious sclerotic lesion of the pelvic CT today), the lesser trochanter of the  left femur, and 2 posterior ribs- highly suspicious for bony metastases. Small, more indeterminate lesions were seen in the right upper throacic vertebra, approxitmately T6, and the left scapula or left lateral 3rd rib.   The patient was seen by Dr. Alen Farrell on 05/01/2018 and the recommendation was to start androgen deprivation therapy in combination with Zytiga and prednisone as well as consideration for treatment of the primary tumor with XRT. He started ADT with Lupron on 05/19/2018. His first dose of Zytiga was 05/19/18 as well.  He has kindly been referred today for discussion of potential radiation treatment options. He is accompanied by his wife and seems to be doing very well. He does report chronic left shoulder pain from repetative motion/use on the job and aching in his hips and legs after standing on his job all day- no change recently. He takes aleve to help him with his shoulder pain. He denies diarrhea, constipation, urinary pain, frequency, dysuria, urgency, incomplete emptying, incontinence or hematuria.    PREVIOUS RADIATION THERAPY: No  PAST MEDICAL HISTORY:  Past Medical History:  Diagnosis Date  . Hyperlipemia   . Hypertension   . Prostate cancer ([place]) 03/24/2018   prostate cancer with bony mets      PAST SURGICAL HISTORY: Past Surgical History:  Procedure Laterality Date  . PROSTATE BIOPSY    . TONSILLECTOMY      FAMILY HISTORY:  Family History  Problem Relation Age of Onset  . Seizures Brother   .  Aneurysm Brother   . Stroke Brother     SOCIAL HISTORY:  Social History   Socioeconomic History  . Marital status: Married    Spouse name: Not on file  . Number of children: 2  . Years of education: Not on file  . Highest education level: Not on file  Occupational History  . Not on file  Social Needs  . Financial resource strain: Not on file  . Food insecurity:    Worry: Not on file    Inability: Not on file  . Transportation needs:    Medical: Not on file      Non-medical: Not on file  Tobacco Use  . Smoking status: Current Every Day Smoker    Packs/day: 0.50    Years: 36.00    Pack years: 18.00    Types: Cigarettes  . Smokeless tobacco: Never Used  Substance and Sexual Activity  . Alcohol use: Never    Frequency: Never  . Drug use: Never  . Sexual activity: Not Currently  Lifestyle  . Physical activity:    Days per week: Not on file    Minutes per session: Not on file  . Stress: Not on file  Relationships  . Social connections:    Talks on phone: Not on file    Gets together: Not on file    Attends religious service: Not on file    Active member of club or organization: Not on file    Attends meetings of clubs or organizations: Not on file    Relationship status: Not on file  . Intimate partner violence:    Fear of current or ex partner: Not on file    Emotionally abused: Not on file    Physically abused: Not on file    Forced sexual activity: Not on file  Other Topics Concern  . Not on file  Social History Narrative   Marred with one son and one daughter.     ALLERGIES: Patient has no known allergies.  MEDICATIONS:  Current Outpatient Medications  Medication Sig Dispense Refill  . fenofibrate 160 MG tablet TAKE 1 TABLET BY MOUTH ONCE A DAY FOR CHOLESTEROL  3  . Multiple Vitamin (MULTIVITAMIN) tablet Take 1 tablet by mouth daily.    . predniSONE (DELTASONE) 5 MG tablet Take 1 tablet (5 mg total) by mouth daily with breakfast. 90 tablet 3  . SUMAtriptan Succinate (ZEMBRACE SYMTOUCH) 3 MG/0.5ML SOAJ Inject 3 mg into the skin as directed. At earliest onset, may repeat in 2 hours if headache persists or reoccurs 4.5 mL 3  . valsartan-hydrochlorothiazide (DIOVAN-HCT) 160-12.5 MG tablet Take 1 tablet by mouth daily.  0  . abiraterone acetate (ZYTIGA) 250 MG tablet TAKE 4 TABLETS BY MOUTH ONCE DAILY. TAKE ON AN EMPTY STOMACH (AT LEAST1 HR BEFORE OR 2 HR AFTER EATING) SWALLOW WHOLE. 120 tablet 0  . naproxen sodium (ANAPROX) 220  MG tablet Take 220 mg by mouth 2 (two) times daily with a meal.     No current facility-administered medications for this encounter.     REVIEW OF SYSTEMS:  On review of systems, the patient reports that he is doing well overall.He denies any chest pain, shortness of breath, cough, fevers, chills, night sweats, unintended weight changes. He denies any bowel disturbances, and denies abdominal pain, nausea or vomiting. He denies any new musculoskeletal or joint aches or pains. His IPSS was 3, indicating mild urinary symptoms. He reports ED with SHIM indicating that he is almost never able  to complete sexual activity. A complete review of systems is obtained and is otherwise negative.    PHYSICAL EXAM:  Wt Readings from Last 3 Encounters:  05/29/18 173 lb 4.8 oz (78.6 kg)  05/01/18 171 lb 3.2 oz (77.7 kg)  12/26/16 172 lb 4.8 oz (78.2 kg)   Temp Readings from Last 3 Encounters:  05/29/18 98.2 F (36.8 C) (Oral)  05/01/18 98 F (36.7 C) (Oral)   BP Readings from Last 3 Encounters:  05/29/18 134/86  05/01/18 131/83  12/26/16 122/62   Pulse Readings from Last 3 Encounters:  05/29/18 75  05/01/18 68  12/26/16 71   Pain Assessment Pain Score: 0-No pain/10  In general this is a well appearing Caucasian male in no acute distress. He is alert and oriented x4 and appropriate throughout the examination. HEENT reveals that the patient is normocephalic, atraumatic. EOMs are intact. PERRLA. Skin is intact without any evidence of gross lesions. Cardiovascular exam reveals a regular rate and rhythm, no clicks rubs or murmurs are auscultated. Chest is clear to auscultation bilaterally. Lymphatic assessment is performed and does not reveal any adenopathy in the cervical, supraclavicular, axillary, or inguinal chains. Abdomen has active bowel sounds in all quadrants and is intact. The abdomen is soft, non tender, non distended. Lower extremities are negative for pretibial pitting edema, deep calf  tenderness, cyanosis or clubbing.   KPS = 100  100 - Normal; no complaints; no evidence of disease. 90   - Able to carry on normal activity; minor signs or symptoms of disease. 80   - Normal activity with effort; some signs or symptoms of disease. 29   - Cares for self; unable to carry on normal activity or to do active work. 60   - Requires occasional assistance, but is able to care for most of his personal needs. 50   - Requires considerable assistance and frequent medical care. 51   - Disabled; requires special care and assistance. 25   - Severely disabled; hospital admission is indicated although death not imminent. 63   - Very sick; hospital admission necessary; active supportive treatment necessary. 10   - Moribund; fatal processes progressing rapidly. 0     - Dead  Karnofsky DA, Abelmann WH, Craver LS and Burchenal JH [phone]) The use of the nitrogen mustards in the palliative treatment of carcinoma: with particular reference to bronchogenic carcinoma Cancer 1 [phone]  LABORATORY DATA:  No results found for: WBC, HGB, HCT, MCV, PLT No results found for: NA, K, CL, CO2 No results found for: ALT, AST, GGT, ALKPHOS, BILITOT   RADIOGRAPHY: No results found.    IMPRESSION/PLAN: 1. 57 y.o. 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  We discussed the patient's workup and outlined the nature of prostate cancer in this setting. The patient's T stage, Gleason's score, and PSA put him into the high risk group. Accordingly, he is eligible for a multimodal treatment approach with 8 weeks of prostate IMRT in combination with LT-ADT and systemic therapy with Zytiga and Prednisone.   We discussed the available radiation techniques, and focused on the details and logistics and delivery. We discussed and outlined the risks, benefits, short and long-term effects associated with radiotherapy and  compared and contrasted these with prostatectomy. We discussed the role of SpaceOAR in reducing the rectal toxicity associated with radiotherapy. We also detailed the role of ADT in the treatment of prostate cancer  and outlined the associated side effects that could be expected with this therapy.  The patient was encouraged to ask questions that were answered to his stated satisfaction.   The conclusion of our conversation, the patient elects to proceed with definitive radiotherapy to the prostate in combination with ADT and systemic therapy as recommended.  We will share this information with Dr. Louis Meckel and Dr. Alen Farrell.  He has already started Lupron and Zytiga under the care and direction of Dr. Alen Farrell.  He did not tolerate prostate biopsy in the office under local anesthesia well so he strongly requests to have fiducial markers +/- SpaceOAR placed in the OR, under general anesthesia with Dr. Louis Meckel.  We will begin coordinating this with Dr. Carlton Adam surgery schedulers to occur in the next 4-6 weeks.  After approximately 8 weeks of ADT and following the placement of fiducial markers he will be ready to proceed with CT simulation in anticipation of beginning an 8-week course of prostate IMRT in early September.  He appears to have a good understanding of his disease and our treatment recommendations.  He is in agreement with and comfortable with the stated plan.    [name], PA-C    Tyler Pita, MD  [facility] Direct Dial: [phone]  Fax: [phone] [email or web address]  Skype  [email or web address]    Page Me   This document serves as a record of services personally performed by Tyler Pita, MD and [name], PA-C. It was created on their behalf by Vanessa Ralphs, a trained medical scribe. The creation of this record is based on the scribe's personal observations and the provider's statements to them. This document has been checked and approved by the  attending provider.

## 2018-05-30 ENCOUNTER — Other Ambulatory Visit: Payer: Self-pay | Admitting: Oncology

## 2018-05-30 DIAGNOSIS — C61 Malignant neoplasm of prostate: Secondary | ICD-10-CM

## 2018-06-09 ENCOUNTER — Inpatient Hospital Stay: Payer: BC Managed Care – PPO

## 2018-06-09 ENCOUNTER — Inpatient Hospital Stay (HOSPITAL_BASED_OUTPATIENT_CLINIC_OR_DEPARTMENT_OTHER): Payer: BC Managed Care – PPO | Admitting: Oncology

## 2018-06-09 VITALS — BP 119/76 | HR 78 | Temp 98.2°F | Resp 18 | Ht 70.0 in | Wt 175.0 lb

## 2018-06-09 DIAGNOSIS — Z79899 Other long term (current) drug therapy: Secondary | ICD-10-CM

## 2018-06-09 DIAGNOSIS — C61 Malignant neoplasm of prostate: Secondary | ICD-10-CM

## 2018-06-09 DIAGNOSIS — E876 Hypokalemia: Secondary | ICD-10-CM | POA: Diagnosis not present

## 2018-06-09 DIAGNOSIS — I10 Essential (primary) hypertension: Secondary | ICD-10-CM

## 2018-06-09 DIAGNOSIS — C7951 Secondary malignant neoplasm of bone: Secondary | ICD-10-CM

## 2018-06-09 LAB — CMP (CANCER CENTER ONLY)
ALBUMIN: 4.2 g/dL (ref 3.5–5.0)
ALT: 14 U/L (ref 0–44)
AST: 21 U/L (ref 15–41)
Alkaline Phosphatase: 78 U/L (ref 38–126)
Anion gap: 8 (ref 5–15)
BUN: 14 mg/dL (ref 6–20)
CHLORIDE: 102 mmol/L (ref 98–111)
CO2: 28 mmol/L (ref 22–32)
Calcium: 9.6 mg/dL (ref 8.9–10.3)
Creatinine: 1.01 mg/dL (ref 0.61–1.24)
GFR, Est AFR Am: >60 mL/min (ref >60)
Glucose, Bld: 90 mg/dL (ref 70–99)
POTASSIUM: 4 mmol/L (ref 3.5–5.1)
SODIUM: 138 mmol/L (ref 135–145)
Total Bilirubin: 0.3 mg/dL (ref 0.3–1.2)
Total Protein: 7.4 g/dL (ref 6.5–8.1)

## 2018-06-09 LAB — CBC WITH DIFFERENTIAL (CANCER CENTER ONLY)
Basophils Absolute: 0 10*3/uL (ref 0.0–0.1)
Basophils Relative: 0 %
Eosinophils Absolute: 0.2 10*3/uL (ref 0.0–0.5)
Eosinophils Relative: 4 %
HEMATOCRIT: 38.7 % (ref 38.4–49.9)
Hemoglobin: 13.2 g/dL (ref 13.0–17.1)
LYMPHS ABS: 1.7 10*3/uL (ref 0.9–3.3)
LYMPHS PCT: 33 %
MCH: 32 pg (ref 27.2–33.4)
MCHC: 34.1 g/dL (ref 32.0–36.0)
MCV: 93.7 fL (ref 79.3–98.0)
MONO ABS: 0.3 10*3/uL (ref 0.1–0.9)
Monocytes Relative: 7 %
Neutro Abs: 2.8 10*3/uL (ref 1.5–6.5)
Neutrophils Relative %: 56 %
PLATELETS: 212 10*3/uL (ref 140–400)
RBC: 4.13 MIL/uL — ABNORMAL LOW (ref 4.20–5.82)
RDW: 12.6 % (ref 11.0–14.6)
WBC: 5 10*3/uL (ref 4.0–10.3)

## 2018-06-09 NOTE — Progress Notes (Signed)
Hematology and Oncology Follow Up Visit  Dan Farrell [id number] December 07, 1960 57 y.o. 06/09/2018 10:13 AM [name], MDEhinger, [name], MD   Principle Diagnosis: 57 year old man with a prostate cancer diagnosed in May 2019.  He presented with a PSA of 11, Gleason score 4+4 = 8 with documented disease in the left pubic ramus as well as the left femur indicating advanced castration sensitive disease.   Prior Therapy:  Prostate biopsy obtained on  Current therapy:  Androgen deprivation therapy started on 05/19/2018.  Zytiga 1000 mg with prednisone 5 mg started in July 2019   Interim History: Mr. Torbert is today for a follow-up visit.  Since the last visit, he received his first Lupron shot and started Zytiga without complications.  He denies any excessive fatigue or tiredness.  He denies any changes in his performance status or activity level.  He denies any lower extremity edema or shortness of breath.  He did gain some weight since last visit although his appetite has not changed dramatically.  He does not report any headaches, blurry vision, syncope or seizures. Does not report any fevers, chills or sweats.  Does not report any cough, wheezing or hemoptysis.  Does not report any chest pain, palpitation, orthopnea or leg edema.  Does not report any nausea, vomiting or abdominal pain.  Does not report any constipation or diarrhea.  Does not report any skeletal complaints.    Does not report frequency, urgency or hematuria.  Does not report any skin rashes or lesions. Does not report any heat or cold intolerance.  Does not report any lymphadenopathy or petechiae.  Does not report any anxiety or depression.  Remaining review of systems is negative.    Medications: I have reviewed the patient's current medications.  Current Outpatient Medications  Medication Sig Dispense Refill  . abiraterone acetate (ZYTIGA) 250 MG tablet TAKE 4 TABLETS BY MOUTH ONCE DAILY. TAKE ON AN EMPTY STOMACH  (AT LEAST1 HR BEFORE OR 2 HR AFTER EATING) SWALLOW WHOLE. 120 tablet 0  . fenofibrate 160 MG tablet TAKE 1 TABLET BY MOUTH ONCE A DAY FOR CHOLESTEROL  3  . Multiple Vitamin (MULTIVITAMIN) tablet Take 1 tablet by mouth daily.    . naproxen sodium (ANAPROX) 220 MG tablet Take 220 mg by mouth 2 (two) times daily with a meal.    . predniSONE (DELTASONE) 5 MG tablet Take 1 tablet (5 mg total) by mouth daily with breakfast. 90 tablet 3  . SUMAtriptan Succinate (ZEMBRACE SYMTOUCH) 3 MG/0.5ML SOAJ Inject 3 mg into the skin as directed. At earliest onset, may repeat in 2 hours if headache persists or reoccurs 4.5 mL 3  . valsartan-hydrochlorothiazide (DIOVAN-HCT) 160-12.5 MG tablet Take 1 tablet by mouth daily.  0   No current facility-administered medications for this visit.      Allergies: No Known Allergies  Past Medical History, Surgical history, Social history, and Family History were reviewed and updated.    Physical Exam: Blood pressure 119/76, pulse 78, temperature 98.2 F (36.8 C), temperature source Oral, resp. rate 18, height 5\' 10"  (1.778 m), weight 175 lb (79.4 kg), SpO2 98 %.   ECOG:  General appearance: alert and cooperative appeared without distress. Head: Normocephalic, without obvious abnormality Oropharynx: No oral thrush or ulcers. Eyes: No scleral icterus.  Pupils are equal and round reactive to light. Lymph nodes: Cervical, supraclavicular, and axillary nodes normal. Heart:regular rate and rhythm, S1, S2 without leg edema. Lung:chest clear, no wheezing, rales, normal symmetric air entry Abdomin: soft, non-tender,  without masses or organomegaly. Neurological: No motor, sensory deficits.  Intact deep tendon reflexes. Skin: No rashes or lesions.  No ecchymosis or petechiae. Musculoskeletal: No joint deformity or effusion. Psychiatric: No changes noted in his mood and affect.        Impression and Plan:    57 year old man with:  1.    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  He was found to have oligometastatic disease with limited pelvic bone involvement and mild lymphadenopathy.    He is currently receiving Zytiga with prednisone without any complications.  The natural course of this disease as well as treatment options were discussed today.  He understands this disease is incurable but aggressive therapy is warranted.  We have discussed the rationale for using local therapy with radiation which she is currently under evaluation without under the care of Dr. Tammi Klippel.  For the time being we will continue with Zytiga and prednisone without any dose reduction or delay.    2.  Androgen deprivation: He is currently receiving Lupron without any complications.  Risks and benefits of long-term complications were discussed is agreeable to continue.  3.  Bone directed therapy: He has limited bone disease but would benefit from bony protective purposes in the future.  Will obtain dental clearance and will evaluate him for Xgeva in the next visit.   4.  Liver function test surveillance: We will continue to monitor this on Zytiga.  These will be repeated periodically.  5.  Hypokalemia: We will check his potassium periodically and replace as needed.  6.  Hypertension: His blood pressure is within normal range no need for any adjustment at this time on Zytiga.  He is aware of risk of hypertension associated with this medication.  7.  Prognosis: He is treatment is likely palliative and his performance status is excellent and aggressive therapy remains warranted.  8.  Follow-up: We will be in October 2019 for his next follow-up and Lupron injection.  25 minutes was spent with the patient face-to-face today.  More than 50% of time was dedicated to patient counseling, education discussing the natural course of this disease and future treatment options.       Zola Button, MD 7/29/201910:13 AM

## 2018-06-10 LAB — PROSTATE-SPECIFIC AG, SERUM (LABCORP): Prostate Specific Ag, Serum: 1.7 ng/mL (ref 0.0–4.0)

## 2018-06-17 ENCOUNTER — Encounter: Payer: Self-pay | Admitting: Urology

## 2018-06-17 NOTE — Progress Notes (Addendum)
He started ADT with Lupron on 05/19/2018. His first dose of Zytiga was 05/19/18 as well.  Patient scheduled for fiducial markers with Dr. Louis Meckel on 06/26/18. Insurance denied placement of [place].  CT SIM scheduled for 07/11/18.   [name], PA-C

## 2018-06-27 ENCOUNTER — Other Ambulatory Visit: Payer: Self-pay | Admitting: Oncology

## 2018-06-27 DIAGNOSIS — C61 Malignant neoplasm of prostate: Secondary | ICD-10-CM

## 2018-07-10 ENCOUNTER — Encounter: Payer: Self-pay | Admitting: Medical Oncology

## 2018-07-10 ENCOUNTER — Ambulatory Visit
Admission: RE | Admit: 2018-07-10 | Discharge: 2018-07-10 | Disposition: A | Discharge status: Home / Self Care | Payer: BC Managed Care – PPO | Source: Ambulatory Visit | Attending: Radiation Oncology | Admitting: Radiation Oncology

## 2018-07-10 DIAGNOSIS — C61 Malignant neoplasm of prostate: Secondary | ICD-10-CM | POA: Diagnosis present

## 2018-07-10 DIAGNOSIS — C7951 Secondary malignant neoplasm of bone: Secondary | ICD-10-CM | POA: Insufficient documentation

## 2018-07-10 NOTE — Progress Notes (Signed)
  Radiation Oncology         [phone] ________________________________  Name: Dan Farrell MRN: [id number]  Date: 07/10/2018  DOB: Jan 21, 1961  SIMULATION AND TREATMENT PLANNING NOTE    ICD-10-CM   1. Prostate cancer [facility]) C61     DIAGNOSIS:  57 y.o. 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  NARRATIVE:  The patient was brought to the [facility].  Identity was confirmed.  All relevant records and images related to the planned course of therapy were reviewed.  The patient freely provided informed written consent to proceed with treatment after reviewing the details related to the planned course of therapy. The consent form was witnessed and verified by the simulation staff.  Then, the patient was set-up in a stable reproducible supine position for radiation therapy.  A vacuum lock pillow device was custom fabricated to position his legs in a reproducible immobilized position.  Then, I performed a urethrogram under sterile conditions to identify the prostatic apex.  CT images were obtained.  Surface markings were placed.  The CT images were loaded into the planning software.  Then the prostate target and avoidance structures including the rectum, bladder, bowel and hips were contoured.  Treatment planning then occurred.  The radiation prescription was entered and confirmed.  A total of one complex treatment devices were fabricated. I have requested : Intensity Modulated Radiotherapy (IMRT) is medically necessary for this case for the following reason:  Rectal sparing.[name]  PLAN:  The patient will receive 45 Gy in 25 fractions of 1.8 Gy, followed by a boost to the prostate, bone met and grossly enlarged nodes to a total dose of 75 Gy with 15 additional fractions of 2.0 Gy.  ________________________________  Sheral Apley Tammi Klippel, M.D.

## 2018-07-11 ENCOUNTER — Ambulatory Visit: Payer: BC Managed Care – PPO | Admitting: Radiation Oncology

## 2018-07-16 ENCOUNTER — Other Ambulatory Visit: Payer: Self-pay | Admitting: Family Medicine

## 2018-07-16 DIAGNOSIS — R945 Abnormal results of liver function studies: Principal | ICD-10-CM

## 2018-07-16 DIAGNOSIS — R7989 Other specified abnormal findings of blood chemistry: Secondary | ICD-10-CM

## 2018-07-21 ENCOUNTER — Ambulatory Visit: Payer: BC Managed Care – PPO

## 2018-07-22 ENCOUNTER — Ambulatory Visit: Payer: BC Managed Care – PPO

## 2018-07-23 ENCOUNTER — Ambulatory Visit: Payer: BC Managed Care – PPO

## 2018-07-24 ENCOUNTER — Ambulatory Visit: Payer: BC Managed Care – PPO

## 2018-07-24 ENCOUNTER — Other Ambulatory Visit: Payer: BC Managed Care – PPO

## 2018-07-24 DIAGNOSIS — C61 Malignant neoplasm of prostate: Secondary | ICD-10-CM | POA: Insufficient documentation

## 2018-07-24 DIAGNOSIS — C7951 Secondary malignant neoplasm of bone: Secondary | ICD-10-CM | POA: Insufficient documentation

## 2018-07-25 ENCOUNTER — Ambulatory Visit: Payer: BC Managed Care – PPO

## 2018-07-28 ENCOUNTER — Ambulatory Visit: Payer: BC Managed Care – PPO

## 2018-07-28 ENCOUNTER — Ambulatory Visit
Admission: RE | Admit: 2018-07-28 | Discharge: 2018-07-28 | Disposition: A | Discharge status: Home / Self Care | Payer: BC Managed Care – PPO | Source: Ambulatory Visit | Attending: Radiation Oncology | Admitting: Radiation Oncology

## 2018-07-28 DIAGNOSIS — C7951 Secondary malignant neoplasm of bone: Secondary | ICD-10-CM | POA: Diagnosis not present

## 2018-07-29 ENCOUNTER — Ambulatory Visit: Payer: BC Managed Care – PPO

## 2018-07-29 ENCOUNTER — Ambulatory Visit
Admission: RE | Admit: 2018-07-29 | Discharge: 2018-07-29 | Disposition: A | Discharge status: Home / Self Care | Payer: BC Managed Care – PPO | Source: Ambulatory Visit | Attending: Radiation Oncology | Admitting: Radiation Oncology

## 2018-07-29 DIAGNOSIS — C7951 Secondary malignant neoplasm of bone: Secondary | ICD-10-CM | POA: Diagnosis not present

## 2018-07-30 ENCOUNTER — Ambulatory Visit: Payer: BC Managed Care – PPO

## 2018-07-30 ENCOUNTER — Ambulatory Visit
Admission: RE | Admit: 2018-07-30 | Discharge: 2018-07-30 | Disposition: A | Discharge status: Home / Self Care | Payer: BC Managed Care – PPO | Source: Ambulatory Visit | Attending: Radiation Oncology | Admitting: Radiation Oncology

## 2018-07-30 DIAGNOSIS — C7951 Secondary malignant neoplasm of bone: Secondary | ICD-10-CM | POA: Diagnosis not present

## 2018-07-31 ENCOUNTER — Ambulatory Visit: Payer: BC Managed Care – PPO

## 2018-07-31 ENCOUNTER — Ambulatory Visit
Admission: RE | Admit: 2018-07-31 | Discharge: 2018-07-31 | Disposition: A | Discharge status: Home / Self Care | Payer: BC Managed Care – PPO | Source: Ambulatory Visit | Attending: Radiation Oncology | Admitting: Radiation Oncology

## 2018-07-31 DIAGNOSIS — C7951 Secondary malignant neoplasm of bone: Secondary | ICD-10-CM | POA: Diagnosis not present

## 2018-08-01 ENCOUNTER — Ambulatory Visit: Payer: BC Managed Care – PPO

## 2018-08-01 ENCOUNTER — Ambulatory Visit
Admission: RE | Admit: 2018-08-01 | Discharge: 2018-08-01 | Disposition: A | Discharge status: Home / Self Care | Payer: BC Managed Care – PPO | Source: Ambulatory Visit | Attending: Radiation Oncology | Admitting: Radiation Oncology

## 2018-08-01 DIAGNOSIS — C7951 Secondary malignant neoplasm of bone: Secondary | ICD-10-CM | POA: Diagnosis not present

## 2018-08-01 NOTE — Progress Notes (Signed)
Pt here for patient teaching.  Pt given Radiation and You booklet.  Reviewed areas of pertinence such as diarrhea, fatigue and urinary and bladder changes . Pt able to give teach back of have Imodium on hand and drink plenty of water,. Pt demonstrated understanding, and will contact nursing with any questions or concerns.     [email or web address]

## 2018-08-04 ENCOUNTER — Ambulatory Visit
Admission: RE | Admit: 2018-08-04 | Discharge: 2018-08-04 | Disposition: A | Discharge status: Home / Self Care | Payer: BC Managed Care – PPO | Source: Ambulatory Visit | Attending: Radiation Oncology | Admitting: Radiation Oncology

## 2018-08-04 ENCOUNTER — Ambulatory Visit: Payer: BC Managed Care – PPO

## 2018-08-04 DIAGNOSIS — C7951 Secondary malignant neoplasm of bone: Secondary | ICD-10-CM | POA: Diagnosis not present

## 2018-08-05 ENCOUNTER — Ambulatory Visit: Payer: BC Managed Care – PPO

## 2018-08-05 ENCOUNTER — Ambulatory Visit
Admission: RE | Admit: 2018-08-05 | Discharge: 2018-08-05 | Disposition: A | Discharge status: Home / Self Care | Payer: BC Managed Care – PPO | Source: Ambulatory Visit | Attending: Radiation Oncology | Admitting: Radiation Oncology

## 2018-08-05 DIAGNOSIS — C7951 Secondary malignant neoplasm of bone: Secondary | ICD-10-CM | POA: Diagnosis not present

## 2018-08-06 ENCOUNTER — Ambulatory Visit: Payer: BC Managed Care – PPO

## 2018-08-06 ENCOUNTER — Telehealth: Payer: Self-pay | Admitting: Oncology

## 2018-08-06 ENCOUNTER — Ambulatory Visit
Admission: RE | Admit: 2018-08-06 | Discharge: 2018-08-06 | Disposition: A | Discharge status: Home / Self Care | Payer: BC Managed Care – PPO | Source: Ambulatory Visit | Attending: Radiation Oncology | Admitting: Radiation Oncology

## 2018-08-06 DIAGNOSIS — C7951 Secondary malignant neoplasm of bone: Secondary | ICD-10-CM | POA: Diagnosis not present

## 2018-08-06 NOTE — Telephone Encounter (Signed)
Spoke with patient re moving appointments from 10/8 to 10/9.

## 2018-08-06 NOTE — Telephone Encounter (Signed)
CALL DAY - moved from 10/8 to 10/9

## 2018-08-07 ENCOUNTER — Ambulatory Visit: Payer: BC Managed Care – PPO

## 2018-08-07 ENCOUNTER — Ambulatory Visit
Admission: RE | Admit: 2018-08-07 | Discharge: 2018-08-07 | Disposition: A | Discharge status: Home / Self Care | Payer: BC Managed Care – PPO | Source: Ambulatory Visit | Attending: Radiation Oncology | Admitting: Radiation Oncology

## 2018-08-07 DIAGNOSIS — C7951 Secondary malignant neoplasm of bone: Secondary | ICD-10-CM | POA: Diagnosis not present

## 2018-08-08 ENCOUNTER — Ambulatory Visit
Admission: RE | Admit: 2018-08-08 | Discharge: 2018-08-08 | Disposition: A | Discharge status: Home / Self Care | Payer: BC Managed Care – PPO | Source: Ambulatory Visit | Attending: Radiation Oncology | Admitting: Radiation Oncology

## 2018-08-08 ENCOUNTER — Ambulatory Visit: Payer: BC Managed Care – PPO

## 2018-08-08 DIAGNOSIS — C7951 Secondary malignant neoplasm of bone: Secondary | ICD-10-CM | POA: Diagnosis not present

## 2018-08-11 ENCOUNTER — Ambulatory Visit
Admission: RE | Admit: 2018-08-11 | Discharge: 2018-08-11 | Disposition: A | Discharge status: Home / Self Care | Payer: BC Managed Care – PPO | Source: Ambulatory Visit | Attending: Radiation Oncology | Admitting: Radiation Oncology

## 2018-08-11 ENCOUNTER — Ambulatory Visit: Payer: BC Managed Care – PPO

## 2018-08-11 ENCOUNTER — Encounter: Payer: Self-pay | Admitting: Medical Oncology

## 2018-08-11 DIAGNOSIS — C7951 Secondary malignant neoplasm of bone: Secondary | ICD-10-CM | POA: Diagnosis not present

## 2018-08-12 ENCOUNTER — Ambulatory Visit
Admission: RE | Admit: 2018-08-12 | Discharge: 2018-08-12 | Disposition: A | Discharge status: Home / Self Care | Payer: BC Managed Care – PPO | Source: Ambulatory Visit | Attending: Radiation Oncology | Admitting: Radiation Oncology

## 2018-08-12 ENCOUNTER — Ambulatory Visit: Payer: BC Managed Care – PPO

## 2018-08-12 DIAGNOSIS — C7951 Secondary malignant neoplasm of bone: Secondary | ICD-10-CM | POA: Insufficient documentation

## 2018-08-12 DIAGNOSIS — C61 Malignant neoplasm of prostate: Secondary | ICD-10-CM | POA: Insufficient documentation

## 2018-08-13 ENCOUNTER — Ambulatory Visit: Payer: BC Managed Care – PPO

## 2018-08-13 ENCOUNTER — Ambulatory Visit
Admission: RE | Admit: 2018-08-13 | Discharge: 2018-08-13 | Disposition: A | Discharge status: Home / Self Care | Payer: BC Managed Care – PPO | Source: Ambulatory Visit | Attending: Radiation Oncology | Admitting: Radiation Oncology

## 2018-08-13 ENCOUNTER — Other Ambulatory Visit: Payer: Self-pay | Admitting: *Deleted

## 2018-08-13 DIAGNOSIS — C7951 Secondary malignant neoplasm of bone: Secondary | ICD-10-CM | POA: Diagnosis not present

## 2018-08-13 DIAGNOSIS — C61 Malignant neoplasm of prostate: Secondary | ICD-10-CM

## 2018-08-13 MED ORDER — ABIRATERONE ACETATE 250 MG PO TABS: 1000.0000 mg | ORAL_TABLET | Freq: Every day | ORAL | 0 refills | Status: DC

## 2018-08-14 ENCOUNTER — Ambulatory Visit: Payer: BC Managed Care – PPO

## 2018-08-14 ENCOUNTER — Ambulatory Visit
Admission: RE | Admit: 2018-08-14 | Discharge: 2018-08-14 | Disposition: A | Discharge status: Home / Self Care | Payer: BC Managed Care – PPO | Source: Ambulatory Visit | Attending: Radiation Oncology | Admitting: Radiation Oncology

## 2018-08-14 DIAGNOSIS — C7951 Secondary malignant neoplasm of bone: Secondary | ICD-10-CM | POA: Diagnosis not present

## 2018-08-15 ENCOUNTER — Ambulatory Visit
Admission: RE | Admit: 2018-08-15 | Discharge: 2018-08-15 | Disposition: A | Discharge status: Home / Self Care | Payer: BC Managed Care – PPO | Source: Ambulatory Visit | Attending: Radiation Oncology | Admitting: Radiation Oncology

## 2018-08-15 ENCOUNTER — Ambulatory Visit: Payer: BC Managed Care – PPO

## 2018-08-15 DIAGNOSIS — C7951 Secondary malignant neoplasm of bone: Secondary | ICD-10-CM | POA: Diagnosis not present

## 2018-08-18 ENCOUNTER — Ambulatory Visit
Admission: RE | Admit: 2018-08-18 | Discharge: 2018-08-18 | Disposition: A | Discharge status: Home / Self Care | Payer: BC Managed Care – PPO | Source: Ambulatory Visit | Attending: Radiation Oncology | Admitting: Radiation Oncology

## 2018-08-18 ENCOUNTER — Other Ambulatory Visit: Payer: Self-pay | Admitting: Oncology

## 2018-08-18 ENCOUNTER — Ambulatory Visit: Payer: BC Managed Care – PPO

## 2018-08-18 DIAGNOSIS — C7951 Secondary malignant neoplasm of bone: Secondary | ICD-10-CM | POA: Diagnosis not present

## 2018-08-19 ENCOUNTER — Ambulatory Visit: Payer: BC Managed Care – PPO | Admitting: Oncology

## 2018-08-19 ENCOUNTER — Other Ambulatory Visit: Payer: BC Managed Care – PPO

## 2018-08-19 ENCOUNTER — Ambulatory Visit: Payer: BC Managed Care – PPO

## 2018-08-19 ENCOUNTER — Ambulatory Visit: Admission: RE | Admit: 2018-08-19 | Payer: BC Managed Care – PPO | Source: Ambulatory Visit

## 2018-08-20 ENCOUNTER — Inpatient Hospital Stay: Payer: BC Managed Care – PPO

## 2018-08-20 ENCOUNTER — Inpatient Hospital Stay: Payer: BC Managed Care – PPO | Attending: Oncology

## 2018-08-20 ENCOUNTER — Ambulatory Visit: Payer: BC Managed Care – PPO

## 2018-08-20 ENCOUNTER — Ambulatory Visit
Admission: RE | Admit: 2018-08-20 | Discharge: 2018-08-20 | Disposition: A | Discharge status: Home / Self Care | Payer: BC Managed Care – PPO | Source: Ambulatory Visit | Attending: Radiation Oncology | Admitting: Radiation Oncology

## 2018-08-20 ENCOUNTER — Inpatient Hospital Stay (HOSPITAL_BASED_OUTPATIENT_CLINIC_OR_DEPARTMENT_OTHER): Payer: BC Managed Care – PPO | Admitting: Oncology

## 2018-08-20 ENCOUNTER — Telehealth: Payer: Self-pay | Admitting: Oncology

## 2018-08-20 VITALS — BP 119/66 | HR 94 | Temp 98.8°F | Resp 18 | Ht 70.0 in | Wt 177.7 lb

## 2018-08-20 DIAGNOSIS — Z79899 Other long term (current) drug therapy: Secondary | ICD-10-CM | POA: Diagnosis not present

## 2018-08-20 DIAGNOSIS — E876 Hypokalemia: Secondary | ICD-10-CM | POA: Insufficient documentation

## 2018-08-20 DIAGNOSIS — I10 Essential (primary) hypertension: Secondary | ICD-10-CM

## 2018-08-20 DIAGNOSIS — C61 Malignant neoplasm of prostate: Secondary | ICD-10-CM | POA: Diagnosis present

## 2018-08-20 DIAGNOSIS — C7951 Secondary malignant neoplasm of bone: Secondary | ICD-10-CM | POA: Diagnosis not present

## 2018-08-20 LAB — CBC WITH DIFFERENTIAL (CANCER CENTER ONLY)
ABS IMMATURE GRANULOCYTES: 0.01 10*3/uL (ref 0.00–0.07)
BASOS ABS: 0 10*3/uL (ref 0.0–0.1)
Basophils Relative: 0 %
Eosinophils Absolute: 0.1 10*3/uL (ref 0.0–0.5)
Eosinophils Relative: 3 %
HEMATOCRIT: 32.4 % — AB (ref 39.0–52.0)
Hemoglobin: 11.3 g/dL — ABNORMAL LOW (ref 13.0–17.0)
IMMATURE GRANULOCYTES: 0 %
LYMPHS ABS: 0.4 10*3/uL — AB (ref 0.7–4.0)
Lymphocytes Relative: 16 %
MCH: 32.8 pg (ref 26.0–34.0)
MCHC: 34.9 g/dL (ref 30.0–36.0)
MCV: 93.9 fL (ref 80.0–100.0)
MONOS PCT: 12 %
Monocytes Absolute: 0.3 10*3/uL (ref 0.1–1.0)
NEUTROS PCT: 69 %
Neutro Abs: 1.8 10*3/uL (ref 1.7–7.7)
Platelet Count: 122 10*3/uL — ABNORMAL LOW (ref 150–400)
RBC: 3.45 MIL/uL — AB (ref 4.22–5.81)
RDW: 13.5 % (ref 11.5–15.5)
WBC Count: 2.7 10*3/uL — ABNORMAL LOW (ref 4.0–10.5)
nRBC: 0 % (ref 0.0–0.2)

## 2018-08-20 LAB — CMP (CANCER CENTER ONLY)
ALBUMIN: 3.9 g/dL (ref 3.5–5.0)
ALT: 17 U/L (ref 0–44)
ANION GAP: 11 (ref 5–15)
AST: 24 U/L (ref 15–41)
Alkaline Phosphatase: 48 U/L (ref 38–126)
BUN: 12 mg/dL (ref 6–20)
CALCIUM: 9.5 mg/dL (ref 8.9–10.3)
CO2: 26 mmol/L (ref 22–32)
Chloride: 102 mmol/L (ref 98–111)
Creatinine: 0.86 mg/dL (ref 0.61–1.24)
GFR, Est AFR Am: >60 mL/min (ref >60)
GFR, Estimated: >60 mL/min (ref >60)
GLUCOSE: 100 mg/dL — AB (ref 70–99)
Potassium: 3.4 mmol/L — ABNORMAL LOW (ref 3.5–5.1)
SODIUM: 139 mmol/L (ref 135–145)
Total Bilirubin: 0.5 mg/dL (ref 0.3–1.2)
Total Protein: 7.1 g/dL (ref 6.5–8.1)

## 2018-08-20 MED ORDER — CALCIUM CARBONATE-VITAMIN D 500-200 MG-UNIT PO TABS: 1.0000 | ORAL_TABLET | Freq: Two times a day (BID) | ORAL | 3 refills | Status: DC

## 2018-08-20 NOTE — Telephone Encounter (Signed)
Appts scheduled avs/calendar printed per 10/9 los °

## 2018-08-20 NOTE — Progress Notes (Signed)
Hematology and Oncology Follow Up Visit  Dan Farrell [id number] 04-12-1961 57 y.o. 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  He presented with a PSA of 11, Gleason score 4+4 = 8.    Prior Therapy:  Prostate biopsy obtained on  Current therapy:  Androgen deprivation therapy started on 05/19/2018.  Zytiga 1000 mg with prednisone 5 mg started in July 2019  Radiation therapy to the prostate only ongoing.  Interim History: Dan Farrell returns today for a follow-up visit.  Since the last visit, he reports no major changes in his health.  He continues to tolerate Zytiga without any major complications.  He denies any excessive fatigue or tiredness.  He denies any complications related to radiation.  His appetite and performance status remain excellent.  He does not report any headaches, blurry vision, syncope or seizures.  He denies any dizziness or confusion.  Does not report any fevers, chills or sweats.  Does not report any cough, wheezing or hemoptysis.  Does not report any chest pain, palpitation, orthopnea or leg edema.  Does not report any nausea, vomiting or abdominal pain.  Does not report any edges in bowel habits. Does not report any myalgias or myalgias.   Does not report frequency, urgency or hematuria.  Does not report any skin rashes or lesions. Does not report any heat or cold intolerance.  Does not report any lymphadenopathy or petechiae.  Does not report any ranges in his mood.  Remaining review of systems is negative.    Medications: I have reviewed the patient's current medications.  Current Outpatient Medications  Medication Sig Dispense Refill  . abiraterone acetate (ZYTIGA) 250 MG tablet Take 4 tablets (1,000 mg total) by mouth daily. Take on an empty stomach 1 hour before or 2 hours after a meal. 120 tablet 0  . calcium-vitamin D (OSCAL  WITH D) 500-200 MG-UNIT tablet Take 1 tablet by mouth 2 (two) times daily. 90 tablet 3  . fenofibrate 160 MG tablet TAKE 1 TABLET BY MOUTH ONCE A DAY FOR CHOLESTEROL  3  . Multiple Vitamin (MULTIVITAMIN) tablet Take 1 tablet by mouth daily.    . naproxen sodium (ANAPROX) 220 MG tablet Take 220 mg by mouth 2 (two) times daily with a meal.    . predniSONE (DELTASONE) 5 MG tablet Take 1 tablet (5 mg total) by mouth daily with breakfast. 90 tablet 3  . SUMAtriptan Succinate (ZEMBRACE SYMTOUCH) 3 MG/0.5ML SOAJ Inject 3 mg into the skin as directed. At earliest onset, may repeat in 2 hours if headache persists or reoccurs 4.5 mL 3  . valsartan-hydrochlorothiazide (DIOVAN-HCT) 160-12.5 MG tablet Take 1 tablet by mouth daily.  0   No current facility-administered medications for this visit.      Allergies: No Known Allergies  Past Medical History, Surgical history, Social history, and Family History were reviewed and updated.    Physical Exam: Blood pressure 119/66, pulse 94, temperature 98.8 F (37.1 C), temperature source Oral, resp. rate 18, height 5\' 10"  (1.778 m), weight 177 lb 11.2 oz (80.6 kg), SpO2 99 %.   ECOG: 0   General appearance: Comfortable appearing without any discomfort Head: Normocephalic without any trauma Oropharynx: Mucous membranes are moist and pink without any thrush or ulcers. Eyes: Pupils are equal and round reactive to light. Lymph nodes: No cervical, supraclavicular, inguinal or axillary lymphadenopathy.   Heart:regular rate and rhythm.  S1 and S2  without leg edema. Lung: Clear without any rhonchi or wheezes.  No dullness to percussion. Abdomin: Soft, nontender, nondistended with good bowel sounds.  No hepatosplenomegaly. Musculoskeletal: No joint deformity or effusion.  Full range of motion noted. Neurological: No deficits noted on motor, sensory and deep tendon reflex exam. Skin: No petechial rash or dryness.  Appeared moist.       Results for  Dan Farrell (MRN [id number]) as of 08/20/2018 14:01  Ref. Range 06/09/2018 10:03  Prostate Specific Ag, Serum Latest Ref Range: 0.0 - 4.0 ng/mL 1.7     Impression and Plan:    57 year old man with:  1.    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      He continues to tolerate Zytiga without any major issues or complaints.  He is also receiving radiation therapy without any complications.  Risks and benefits of continuing both therapies were reviewed today and is agreeable to continue.  His PSA showed excellent response currently at 1.7.    2.  Androgen deprivation: Last Lupron was given in July and will be repeated in November 2019.  And every 4 months after that.  3.  Bone directed therapy: The rationale for using Delton See was reviewed today.  Risks and benefits and complications were also discussed.  These include hypocalcemia and osteonecrosis of the jaw.  He has not obtain dental clearance at this time and his dentist appointment has been rescheduled to November.  We will schedule his next Xgeva injection with Lupron at the time.  Also start him on calcium and vitamin D supplements.  4.  Liver function test surveillance: Liver function test continues to be within normal range.  5.  Hypokalemia: Potassium has been normal and will be repeated periodically.  6.  Hypertension: Blood pressure continues to be within normal range.  7.  Prognosis: His treatment remains palliative but aggressive approach is recommended.  His performance status remain excellent.  8.  Follow-up: We will be in January 2020.  25 minutes was spent with the patient face-to-face today.  More than 50% of time was dedicated to reviewing the natural course of his disease, treatment options and coordinating plan of care.     Zola Button, Farrell 10/9/20191:57 PM

## 2018-08-21 ENCOUNTER — Ambulatory Visit: Payer: BC Managed Care – PPO

## 2018-08-21 ENCOUNTER — Ambulatory Visit
Admission: RE | Admit: 2018-08-21 | Discharge: 2018-08-21 | Disposition: A | Discharge status: Home / Self Care | Payer: BC Managed Care – PPO | Source: Ambulatory Visit | Attending: Radiation Oncology | Admitting: Radiation Oncology

## 2018-08-21 DIAGNOSIS — C7951 Secondary malignant neoplasm of bone: Secondary | ICD-10-CM | POA: Diagnosis not present

## 2018-08-21 LAB — PROSTATE-SPECIFIC AG, SERUM (LABCORP): Prostate Specific Ag, Serum: 0.1 ng/mL (ref 0.0–4.0)

## 2018-08-22 ENCOUNTER — Ambulatory Visit
Admission: RE | Admit: 2018-08-22 | Discharge: 2018-08-22 | Disposition: A | Discharge status: Home / Self Care | Payer: BC Managed Care – PPO | Source: Ambulatory Visit | Attending: Radiation Oncology | Admitting: Radiation Oncology

## 2018-08-22 ENCOUNTER — Ambulatory Visit: Payer: BC Managed Care – PPO

## 2018-08-22 DIAGNOSIS — C7951 Secondary malignant neoplasm of bone: Secondary | ICD-10-CM | POA: Diagnosis not present

## 2018-08-25 ENCOUNTER — Ambulatory Visit
Admission: RE | Admit: 2018-08-25 | Discharge: 2018-08-25 | Disposition: A | Discharge status: Home / Self Care | Payer: BC Managed Care – PPO | Source: Ambulatory Visit | Attending: Radiation Oncology | Admitting: Radiation Oncology

## 2018-08-25 ENCOUNTER — Ambulatory Visit: Payer: BC Managed Care – PPO

## 2018-08-25 DIAGNOSIS — C7951 Secondary malignant neoplasm of bone: Secondary | ICD-10-CM | POA: Diagnosis not present

## 2018-08-26 ENCOUNTER — Ambulatory Visit: Payer: BC Managed Care – PPO

## 2018-08-26 ENCOUNTER — Ambulatory Visit
Admission: RE | Admit: 2018-08-26 | Discharge: 2018-08-26 | Disposition: A | Discharge status: Home / Self Care | Payer: BC Managed Care – PPO | Source: Ambulatory Visit | Attending: Radiation Oncology | Admitting: Radiation Oncology

## 2018-08-26 DIAGNOSIS — C7951 Secondary malignant neoplasm of bone: Secondary | ICD-10-CM | POA: Diagnosis not present

## 2018-08-27 ENCOUNTER — Ambulatory Visit
Admission: RE | Admit: 2018-08-27 | Discharge: 2018-08-27 | Disposition: A | Discharge status: Home / Self Care | Payer: BC Managed Care – PPO | Source: Ambulatory Visit | Attending: Radiation Oncology | Admitting: Radiation Oncology

## 2018-08-27 ENCOUNTER — Telehealth: Payer: Self-pay

## 2018-08-27 ENCOUNTER — Ambulatory Visit: Payer: BC Managed Care – PPO

## 2018-08-27 DIAGNOSIS — C7951 Secondary malignant neoplasm of bone: Secondary | ICD-10-CM | POA: Diagnosis not present

## 2018-08-27 NOTE — Telephone Encounter (Signed)
Contacted patient and made aware of results. 

## 2018-08-27 NOTE — Telephone Encounter (Signed)
-----   Message from Wyatt Portela, MD sent at 08/26/2018  8:28 AM EDT ----- Please let him know his PSA is down.

## 2018-08-28 ENCOUNTER — Ambulatory Visit: Payer: BC Managed Care – PPO

## 2018-08-28 ENCOUNTER — Ambulatory Visit
Admission: RE | Admit: 2018-08-28 | Discharge: 2018-08-28 | Disposition: A | Discharge status: Home / Self Care | Payer: BC Managed Care – PPO | Source: Ambulatory Visit | Attending: Radiation Oncology | Admitting: Radiation Oncology

## 2018-08-28 DIAGNOSIS — C7951 Secondary malignant neoplasm of bone: Secondary | ICD-10-CM | POA: Diagnosis not present

## 2018-08-29 ENCOUNTER — Ambulatory Visit
Admission: RE | Admit: 2018-08-29 | Discharge: 2018-08-29 | Disposition: A | Discharge status: Home / Self Care | Payer: BC Managed Care – PPO | Source: Ambulatory Visit | Attending: Radiation Oncology | Admitting: Radiation Oncology

## 2018-08-29 ENCOUNTER — Ambulatory Visit: Payer: BC Managed Care – PPO

## 2018-08-29 DIAGNOSIS — C7951 Secondary malignant neoplasm of bone: Secondary | ICD-10-CM | POA: Diagnosis not present

## 2018-09-01 ENCOUNTER — Ambulatory Visit
Admission: RE | Admit: 2018-09-01 | Discharge: 2018-09-01 | Disposition: A | Discharge status: Home / Self Care | Payer: BC Managed Care – PPO | Source: Ambulatory Visit | Attending: Radiation Oncology | Admitting: Radiation Oncology

## 2018-09-01 ENCOUNTER — Ambulatory Visit: Payer: BC Managed Care – PPO

## 2018-09-01 DIAGNOSIS — C7951 Secondary malignant neoplasm of bone: Secondary | ICD-10-CM | POA: Diagnosis not present

## 2018-09-02 ENCOUNTER — Ambulatory Visit
Admission: RE | Admit: 2018-09-02 | Discharge: 2018-09-02 | Disposition: A | Discharge status: Home / Self Care | Payer: BC Managed Care – PPO | Source: Ambulatory Visit | Attending: Radiation Oncology | Admitting: Radiation Oncology

## 2018-09-02 ENCOUNTER — Ambulatory Visit: Payer: BC Managed Care – PPO

## 2018-09-02 DIAGNOSIS — C7951 Secondary malignant neoplasm of bone: Secondary | ICD-10-CM | POA: Diagnosis not present

## 2018-09-03 ENCOUNTER — Ambulatory Visit: Payer: BC Managed Care – PPO

## 2018-09-03 ENCOUNTER — Ambulatory Visit
Admission: RE | Admit: 2018-09-03 | Discharge: 2018-09-03 | Disposition: A | Discharge status: Home / Self Care | Payer: BC Managed Care – PPO | Source: Ambulatory Visit | Attending: Radiation Oncology | Admitting: Radiation Oncology

## 2018-09-03 DIAGNOSIS — C7951 Secondary malignant neoplasm of bone: Secondary | ICD-10-CM | POA: Diagnosis not present

## 2018-09-04 ENCOUNTER — Ambulatory Visit
Admission: RE | Admit: 2018-09-04 | Discharge: 2018-09-04 | Disposition: A | Discharge status: Home / Self Care | Payer: BC Managed Care – PPO | Source: Ambulatory Visit | Attending: Radiation Oncology | Admitting: Radiation Oncology

## 2018-09-04 ENCOUNTER — Ambulatory Visit: Payer: BC Managed Care – PPO

## 2018-09-04 DIAGNOSIS — C7951 Secondary malignant neoplasm of bone: Secondary | ICD-10-CM | POA: Diagnosis not present

## 2018-09-05 ENCOUNTER — Ambulatory Visit
Admission: RE | Admit: 2018-09-05 | Discharge: 2018-09-05 | Disposition: A | Discharge status: Home / Self Care | Payer: BC Managed Care – PPO | Source: Ambulatory Visit | Attending: Radiation Oncology | Admitting: Radiation Oncology

## 2018-09-05 ENCOUNTER — Ambulatory Visit: Payer: BC Managed Care – PPO

## 2018-09-05 DIAGNOSIS — C7951 Secondary malignant neoplasm of bone: Secondary | ICD-10-CM | POA: Diagnosis not present

## 2018-09-08 ENCOUNTER — Ambulatory Visit
Admission: RE | Admit: 2018-09-08 | Discharge: 2018-09-08 | Disposition: A | Discharge status: Home / Self Care | Payer: BC Managed Care – PPO | Source: Ambulatory Visit | Attending: Radiation Oncology | Admitting: Radiation Oncology

## 2018-09-08 ENCOUNTER — Ambulatory Visit: Payer: BC Managed Care – PPO

## 2018-09-08 DIAGNOSIS — C7951 Secondary malignant neoplasm of bone: Secondary | ICD-10-CM | POA: Diagnosis not present

## 2018-09-09 ENCOUNTER — Ambulatory Visit: Payer: BC Managed Care – PPO

## 2018-09-09 ENCOUNTER — Ambulatory Visit
Admission: RE | Admit: 2018-09-09 | Discharge: 2018-09-09 | Disposition: A | Discharge status: Home / Self Care | Payer: BC Managed Care – PPO | Source: Ambulatory Visit | Attending: Radiation Oncology | Admitting: Radiation Oncology

## 2018-09-09 DIAGNOSIS — C7951 Secondary malignant neoplasm of bone: Secondary | ICD-10-CM | POA: Diagnosis not present

## 2018-09-10 ENCOUNTER — Ambulatory Visit: Payer: BC Managed Care – PPO

## 2018-09-10 ENCOUNTER — Ambulatory Visit
Admission: RE | Admit: 2018-09-10 | Discharge: 2018-09-10 | Disposition: A | Discharge status: Home / Self Care | Payer: BC Managed Care – PPO | Source: Ambulatory Visit | Attending: Radiation Oncology | Admitting: Radiation Oncology

## 2018-09-10 DIAGNOSIS — C7951 Secondary malignant neoplasm of bone: Secondary | ICD-10-CM | POA: Diagnosis not present

## 2018-09-11 ENCOUNTER — Ambulatory Visit: Payer: BC Managed Care – PPO

## 2018-09-11 ENCOUNTER — Ambulatory Visit
Admission: RE | Admit: 2018-09-11 | Discharge: 2018-09-11 | Disposition: A | Discharge status: Home / Self Care | Payer: BC Managed Care – PPO | Source: Ambulatory Visit | Attending: Radiation Oncology | Admitting: Radiation Oncology

## 2018-09-11 DIAGNOSIS — C7951 Secondary malignant neoplasm of bone: Secondary | ICD-10-CM | POA: Diagnosis not present

## 2018-09-12 ENCOUNTER — Ambulatory Visit: Payer: BC Managed Care – PPO

## 2018-09-12 ENCOUNTER — Ambulatory Visit
Admission: RE | Admit: 2018-09-12 | Discharge: 2018-09-12 | Disposition: A | Discharge status: Home / Self Care | Payer: BC Managed Care – PPO | Source: Ambulatory Visit | Attending: Radiation Oncology | Admitting: Radiation Oncology

## 2018-09-12 DIAGNOSIS — C7951 Secondary malignant neoplasm of bone: Secondary | ICD-10-CM | POA: Diagnosis not present

## 2018-09-12 DIAGNOSIS — C61 Malignant neoplasm of prostate: Secondary | ICD-10-CM | POA: Insufficient documentation

## 2018-09-15 ENCOUNTER — Ambulatory Visit
Admission: RE | Admit: 2018-09-15 | Discharge: 2018-09-15 | Disposition: A | Discharge status: Home / Self Care | Payer: BC Managed Care – PPO | Source: Ambulatory Visit | Attending: Radiation Oncology | Admitting: Radiation Oncology

## 2018-09-15 DIAGNOSIS — C7951 Secondary malignant neoplasm of bone: Secondary | ICD-10-CM | POA: Diagnosis not present

## 2018-09-16 ENCOUNTER — Ambulatory Visit
Admission: RE | Admit: 2018-09-16 | Discharge: 2018-09-16 | Disposition: A | Discharge status: Home / Self Care | Payer: BC Managed Care – PPO | Source: Ambulatory Visit | Attending: Radiation Oncology | Admitting: Radiation Oncology

## 2018-09-16 DIAGNOSIS — C7951 Secondary malignant neoplasm of bone: Secondary | ICD-10-CM | POA: Diagnosis not present

## 2018-09-17 ENCOUNTER — Ambulatory Visit
Admission: RE | Admit: 2018-09-17 | Discharge: 2018-09-17 | Disposition: A | Discharge status: Home / Self Care | Payer: BC Managed Care – PPO | Source: Ambulatory Visit | Attending: Radiation Oncology | Admitting: Radiation Oncology

## 2018-09-17 DIAGNOSIS — C7951 Secondary malignant neoplasm of bone: Secondary | ICD-10-CM | POA: Diagnosis not present

## 2018-09-18 ENCOUNTER — Ambulatory Visit
Admission: RE | Admit: 2018-09-18 | Discharge: 2018-09-18 | Disposition: A | Discharge status: Home / Self Care | Payer: BC Managed Care – PPO | Source: Ambulatory Visit | Attending: Radiation Oncology | Admitting: Radiation Oncology

## 2018-09-18 DIAGNOSIS — C7951 Secondary malignant neoplasm of bone: Secondary | ICD-10-CM | POA: Diagnosis not present

## 2018-09-19 ENCOUNTER — Ambulatory Visit: Payer: BC Managed Care – PPO

## 2018-09-19 ENCOUNTER — Ambulatory Visit
Admission: RE | Admit: 2018-09-19 | Discharge: 2018-09-19 | Disposition: A | Discharge status: Home / Self Care | Payer: BC Managed Care – PPO | Source: Ambulatory Visit | Attending: Radiation Oncology | Admitting: Radiation Oncology

## 2018-09-19 DIAGNOSIS — C7951 Secondary malignant neoplasm of bone: Secondary | ICD-10-CM | POA: Diagnosis not present

## 2018-09-22 ENCOUNTER — Ambulatory Visit
Admission: RE | Admit: 2018-09-22 | Discharge: 2018-09-22 | Disposition: A | Discharge status: Home / Self Care | Payer: BC Managed Care – PPO | Source: Ambulatory Visit | Attending: Radiation Oncology | Admitting: Radiation Oncology

## 2018-09-22 ENCOUNTER — Ambulatory Visit
Admission: RE | Admit: 2018-09-22 | Discharge: 2018-09-22 | Disposition: A | Discharge status: Home / Self Care | Payer: BC Managed Care – PPO | Source: Ambulatory Visit | Attending: Family Medicine | Admitting: Family Medicine

## 2018-09-22 DIAGNOSIS — R945 Abnormal results of liver function studies: Principal | ICD-10-CM

## 2018-09-22 DIAGNOSIS — R7989 Other specified abnormal findings of blood chemistry: Secondary | ICD-10-CM

## 2018-09-22 DIAGNOSIS — C7951 Secondary malignant neoplasm of bone: Secondary | ICD-10-CM | POA: Diagnosis not present

## 2018-09-23 ENCOUNTER — Other Ambulatory Visit: Payer: Self-pay | Admitting: Oncology

## 2018-09-23 DIAGNOSIS — C61 Malignant neoplasm of prostate: Secondary | ICD-10-CM

## 2018-09-29 ENCOUNTER — Encounter: Payer: Self-pay | Admitting: Radiation Oncology

## 2018-09-29 NOTE — Progress Notes (Signed)
  Radiation Oncology         [phone]) [phone] ________________________________  Name: Dan Farrell MRN: [id number]  Date: 09/29/2018  DOB: [birth year]  End of Treatment Note  Diagnosis:   57 y.o. 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     Indication for treatment:  Curative, Definitive Radiotherapy       Radiation treatment dates:   07/28/18 - 09/22/18  Site/dose:   The prostate was treated to an initial dose of 45 Gy in 25 fractions of 1.8 Gy, followed by a boost of 30 Gy in 15 fractions of 2 Gy, for a total of 75 Gy.  Beams/energy:   The patient was treated with IMRT using volumetric arc therapy delivering 6 MV X-rays to clockwise and counterclockwise circumferential arcs with a 90 degree collimator offset to avoid dose scalloping.  Image guidance was performed with daily cone beam CT prior to each fraction to align to gold markers in the prostate and assure proper bladder and rectal fill volumes.  Immobilization was achieved with Body[employer] custom mold.  Narrative: The patient tolerated radiation treatment relatively well.   He experienced some minor urinary irritation and modest fatigue.  He reported nocturia x2, post-void leakage, and occasional incomplete bladder emptying but denied hematuria, fatigue, hesitancy, straining, or urgency.[name] He reported dysuria around week 4 that continued through the end of treatment. He also noted weakening of his stream, with occasional intermittency, as treatments went on. By the end of treatments, he reported issues emptying his bladder, straining, and increased urinary frequency.  Plan: The patient has completed radiation treatment. He will return to radiation oncology clinic for routine followup in one month. I advised him to call or return sooner if he has any questions or concerns related to his recovery or  treatment. ________________________________  Sheral Apley. Tammi Klippel, M.D.  This document serves as a record of services personally performed by Tyler Pita, MD. It was created on his behalf by Wilburn Mylar, a trained medical scribe. The creation of this record is based on the scribe's personal observations and the provider's statements to them. This document has been checked and approved by the attending provider.

## 2018-10-02 ENCOUNTER — Inpatient Hospital Stay: Payer: BC Managed Care – PPO | Attending: Oncology

## 2018-10-02 ENCOUNTER — Inpatient Hospital Stay: Payer: BC Managed Care – PPO

## 2018-10-02 DIAGNOSIS — I10 Essential (primary) hypertension: Secondary | ICD-10-CM | POA: Insufficient documentation

## 2018-10-02 DIAGNOSIS — Z79818 Long term (current) use of other agents affecting estrogen receptors and estrogen levels: Secondary | ICD-10-CM | POA: Insufficient documentation

## 2018-10-02 DIAGNOSIS — C61 Malignant neoplasm of prostate: Secondary | ICD-10-CM | POA: Insufficient documentation

## 2018-10-02 DIAGNOSIS — Z79899 Other long term (current) drug therapy: Secondary | ICD-10-CM | POA: Insufficient documentation

## 2018-10-02 DIAGNOSIS — E876 Hypokalemia: Secondary | ICD-10-CM | POA: Insufficient documentation

## 2018-10-02 LAB — CMP (CANCER CENTER ONLY)
ALBUMIN: 3.9 g/dL (ref 3.5–5.0)
ALK PHOS: 49 U/L (ref 38–126)
ALT: 12 U/L (ref 0–44)
ANION GAP: 10 (ref 5–15)
AST: 19 U/L (ref 15–41)
BUN: 12 mg/dL (ref 6–20)
CO2: 27 mmol/L (ref 22–32)
CREATININE: 1.04 mg/dL (ref 0.61–1.24)
Calcium: 9.5 mg/dL (ref 8.9–10.3)
Chloride: 103 mmol/L (ref 98–111)
GFR, Est AFR Am: >60 mL/min (ref >60)
GFR, Estimated: >60 mL/min (ref >60)
GLUCOSE: 93 mg/dL (ref 70–99)
Potassium: 4.3 mmol/L (ref 3.5–5.1)
SODIUM: 140 mmol/L (ref 135–145)
Total Bilirubin: 0.4 mg/dL (ref 0.3–1.2)
Total Protein: 7.1 g/dL (ref 6.5–8.1)

## 2018-10-02 LAB — CBC WITH DIFFERENTIAL (CANCER CENTER ONLY)
Abs Immature Granulocytes: 0.01 10*3/uL (ref 0.00–0.07)
Basophils Absolute: 0 10*3/uL (ref 0.0–0.1)
Basophils Relative: 0 %
EOS ABS: 0.1 10*3/uL (ref 0.0–0.5)
Eosinophils Relative: 3 %
HEMATOCRIT: 34.6 % — AB (ref 39.0–52.0)
Hemoglobin: 11.9 g/dL — ABNORMAL LOW (ref 13.0–17.0)
IMMATURE GRANULOCYTES: 0 %
LYMPHS ABS: 0.4 10*3/uL — AB (ref 0.7–4.0)
Lymphocytes Relative: 14 %
MCH: 34 pg (ref 26.0–34.0)
MCHC: 34.4 g/dL (ref 30.0–36.0)
MCV: 98.9 fL (ref 80.0–100.0)
MONO ABS: 0.4 10*3/uL (ref 0.1–1.0)
MONOS PCT: 13 %
NEUTROS PCT: 70 %
Neutro Abs: 1.8 10*3/uL (ref 1.7–7.7)
Platelet Count: 180 10*3/uL (ref 150–400)
RBC: 3.5 MIL/uL — ABNORMAL LOW (ref 4.22–5.81)
RDW: 13.4 % (ref 11.5–15.5)
WBC Count: 2.6 10*3/uL — ABNORMAL LOW (ref 4.0–10.5)
nRBC: 0 % (ref 0.0–0.2)

## 2018-10-02 MED ORDER — DENOSUMAB 120 MG/1.7ML ~~LOC~~ SOLN
120.0000 mg | Freq: Once | SUBCUTANEOUS | Status: AC
  Administered 2018-10-02: 120 mg via SUBCUTANEOUS

## 2018-10-02 MED ORDER — LEUPROLIDE ACETATE (4 MONTH) 30 MG IM KIT
30.0000 mg | PACK | Freq: Once | INTRAMUSCULAR | Status: AC
  Administered 2018-10-02: 30 mg via INTRAMUSCULAR
  Filled 2018-10-02: qty 30

## 2018-10-02 NOTE — Progress Notes (Signed)
Pt went to Dentist this week and got clearance to continue xgeva injection ok per DR. Shadad to receive xgeva at this time.

## 2018-10-02 NOTE — Patient Instructions (Signed)
Leuprolide injection What is this medicine? LEUPROLIDE (loo PROE lide) is a man-made hormone. It is used to treat the symptoms of prostate cancer. This medicine may also be used to treat children with early onset of puberty. It may be used for other hormonal conditions. This medicine may be used for other purposes; ask your health care provider or pharmacist if you have questions. COMMON BRAND NAME(S): Lupron What should I tell my health care provider before I take this medicine? They need to know if you have any of these conditions: -diabetes -heart disease or previous heart attack -high blood pressure -high cholesterol -pain or difficulty passing urine -spinal cord metastasis -stroke -tobacco smoker -an unusual or allergic reaction to leuprolide, benzyl alcohol, other medicines, foods, dyes, or preservatives -pregnant or trying to get pregnant -breast-feeding How should I use this medicine? This medicine is for injection under the skin or into a muscle. You will be taught how to prepare and give this medicine. Use exactly as directed. Take your medicine at regular intervals. Do not take your medicine more often than directed. It is important that you put your used needles and syringes in a special sharps container. Do not put them in a trash can. If you do not have a sharps container, call your pharmacist or healthcare provider to get one. A special MedGuide will be given to you by the pharmacist with each prescription and refill. Be sure to read this information carefully each time. Talk to your pediatrician regarding the use of this medicine in children. While this medicine may be prescribed for children as young as 8 years for selected conditions, precautions do apply. Overdosage: If you think you have taken too much of this medicine contact a poison control center or emergency room at once. NOTE: This medicine is only for you. Do not share this medicine with others. What if I miss a  dose? If you miss a dose, take it as soon as you can. If it is almost time for your next dose, take only that dose. Do not take double or extra doses. What may interact with this medicine? Do not take this medicine with any of the following medications: -chasteberry This medicine may also interact with the following medications: -herbal or dietary supplements, like black cohosh or DHEA -male hormones, like estrogens or progestins and birth control pills, patches, rings, or injections -male hormones, like testosterone This list may not describe all possible interactions. Give your health care provider a list of all the medicines, herbs, non-prescription drugs, or dietary supplements you use. Also tell them if you smoke, drink alcohol, or use illegal drugs. Some items may interact with your medicine. What should I watch for while using this medicine? Visit your doctor or health care professional for regular checks on your progress. During the first week, your symptoms may get worse, but then will improve as you continue your treatment. You may get hot flashes, increased bone pain, increased difficulty passing urine, or an aggravation of nerve symptoms. Discuss these effects with your doctor or health care professional, some of them may improve with continued use of this medicine. Male patients may experience a menstrual cycle or spotting during the first 2 months of therapy with this medicine. If this continues, contact your doctor or health care professional. What side effects may I notice from receiving this medicine? Side effects that you should report to your doctor or health care professional as soon as possible: -allergic reactions like skin rash, itching or  hives, swelling of the face, lips, or tongue -breathing problems -chest pain -depression or memory disorders -pain in your legs or groin -pain at site where injected -severe headache -swelling of the feet and legs -visual  changes -vomiting Side effects that usually do not require medical attention (report to your doctor or health care professional if they continue or are bothersome): -breast swelling or tenderness -decrease in sex drive or performance -diarrhea -hot flashes -loss of appetite -muscle, joint, or bone pains -nausea -redness or irritation at site where injected -skin problems or acne This list may not describe all possible side effects. Call your doctor for medical advice about side effects. You may report side effects to FDA at 1-800-FDA-1088. Where should I keep my medicine? Keep out of the reach of children. Store below 25 degrees C (77 degrees F). Do not freeze. Protect from light. Do not use if it is not clear or if there are particles present. Throw away any unused medicine after the expiration date. NOTE: This sheet is a summary. It may not cover all possible information. If you have questions about this medicine, talk to your doctor, pharmacist, or health care provider.  2018 Elsevier/Gold Standard (2016-06-18 10:54:35) Denosumab injection What is this medicine? DENOSUMAB (den oh sue mab) slows bone breakdown. Prolia is used to treat osteoporosis in women after menopause and in men. Delton See is used to treat a high calcium level due to cancer and to prevent bone fractures and other bone problems caused by multiple myeloma or cancer bone metastases. Delton See is also used to treat giant cell tumor of the bone. This medicine may be used for other purposes; ask your health care provider or pharmacist if you have questions. COMMON BRAND NAME(S): Prolia, XGEVA What should I tell my health care provider before I take this medicine? They need to know if you have any of these conditions: -dental disease -having surgery or tooth extraction -infection -kidney disease -low levels of calcium or Vitamin D in the blood -malnutrition -on hemodialysis -skin conditions or sensitivity -thyroid or  parathyroid disease -an unusual reaction to denosumab, other medicines, foods, dyes, or preservatives -pregnant or trying to get pregnant -breast-feeding How should I use this medicine? This medicine is for injection under the skin. It is given by a health care professional in a hospital or clinic setting. If you are getting Prolia, a special MedGuide will be given to you by the pharmacist with each prescription and refill. Be sure to read this information carefully each time. For Prolia, talk to your pediatrician regarding the use of this medicine in children. Special care may be needed. For Delton See, talk to your pediatrician regarding the use of this medicine in children. While this drug may be prescribed for children as young as 13 years for selected conditions, precautions do apply. Overdosage: If you think you have taken too much of this medicine contact a poison control center or emergency room at once. NOTE: This medicine is only for you. Do not share this medicine with others. What if I miss a dose? It is important not to miss your dose. Call your doctor or health care professional if you are unable to keep an appointment. What may interact with this medicine? Do not take this medicine with any of the following medications: -other medicines containing denosumab This medicine may also interact with the following medications: -medicines that lower your chance of fighting infection -steroid medicines like prednisone or cortisone This list may not describe all possible interactions.  Give your health care provider a list of all the medicines, herbs, non-prescription drugs, or dietary supplements you use. Also tell them if you smoke, drink alcohol, or use illegal drugs. Some items may interact with your medicine. What should I watch for while using this medicine? Visit your doctor or health care professional for regular checks on your progress. Your doctor or health care professional may order  blood tests and other tests to see how you are doing. Call your doctor or health care professional for advice if you get a fever, chills or sore throat, or other symptoms of a cold or flu. Do not treat yourself. This drug may decrease your body's ability to fight infection. Try to avoid being around people who are sick. You should make sure you get enough calcium and vitamin D while you are taking this medicine, unless your doctor tells you not to. Discuss the foods you eat and the vitamins you take with your health care professional. See your dentist regularly. Brush and floss your teeth as directed. Before you have any dental work done, tell your dentist you are receiving this medicine. Do not become pregnant while taking this medicine or for 5 months after stopping it. Talk with your doctor or health care professional about your birth control options while taking this medicine. Women should inform their doctor if they wish to become pregnant or think they might be pregnant. There is a potential for serious side effects to an unborn child. Talk to your health care professional or pharmacist for more information. What side effects may I notice from receiving this medicine? Side effects that you should report to your doctor or health care professional as soon as possible: -allergic reactions like skin rash, itching or hives, swelling of the face, lips, or tongue -bone pain -breathing problems -dizziness -jaw pain, especially after dental work -redness, blistering, peeling of the skin -signs and symptoms of infection like fever or chills; cough; sore throat; pain or trouble passing urine -signs of low calcium like fast heartbeat, muscle cramps or muscle pain; pain, tingling, numbness in the hands or feet; seizures -unusual bleeding or bruising -unusually weak or tired Side effects that usually do not require medical attention (report to your doctor or health care professional if they continue or are  bothersome): -constipation -diarrhea -headache -joint pain -loss of appetite -muscle pain -runny nose -tiredness -upset stomach This list may not describe all possible side effects. Call your doctor for medical advice about side effects. You may report side effects to FDA at 1-800-FDA-1088. Where should I keep my medicine? This medicine is only given in a clinic, doctor's office, or other health care setting and will not be stored at home. NOTE: This sheet is a summary. It may not cover all possible information. If you have questions about this medicine, talk to your doctor, pharmacist, or health care provider.  2018 Elsevier/Gold Standard (2016-11-20 19:17:21)

## 2018-10-03 ENCOUNTER — Telehealth: Payer: Self-pay | Admitting: *Deleted

## 2018-10-03 LAB — PROSTATE-SPECIFIC AG, SERUM (LABCORP)

## 2018-10-03 NOTE — Telephone Encounter (Signed)
-----   Message from Wyatt Portela, MD sent at 10/03/2018  7:53 AM EST ----- Please let him now his PSA is low.

## 2018-10-03 NOTE — Telephone Encounter (Signed)
Spoke with patient, gave results of last PSA 

## 2018-10-22 ENCOUNTER — Ambulatory Visit
Admission: RE | Admit: 2018-10-22 | Discharge: 2018-10-22 | Disposition: A | Discharge status: Home / Self Care | Payer: BC Managed Care – PPO | Source: Ambulatory Visit | Attending: Urology | Admitting: Urology

## 2018-10-22 ENCOUNTER — Other Ambulatory Visit: Payer: Self-pay

## 2018-10-22 ENCOUNTER — Encounter: Payer: Self-pay | Admitting: Urology

## 2018-10-22 VITALS — BP 121/80 | HR 91 | Temp 98.9°F | Resp 20 | Ht 70.0 in | Wt 179.2 lb

## 2018-10-22 DIAGNOSIS — C7951 Secondary malignant neoplasm of bone: Secondary | ICD-10-CM | POA: Insufficient documentation

## 2018-10-22 DIAGNOSIS — Z923 Personal history of irradiation: Secondary | ICD-10-CM | POA: Diagnosis not present

## 2018-10-22 DIAGNOSIS — Z79899 Other long term (current) drug therapy: Secondary | ICD-10-CM | POA: Diagnosis not present

## 2018-10-22 DIAGNOSIS — C61 Malignant neoplasm of prostate: Secondary | ICD-10-CM | POA: Insufficient documentation

## 2018-10-23 ENCOUNTER — Other Ambulatory Visit: Payer: Self-pay | Admitting: Urology

## 2018-10-23 MED ORDER — FLUCONAZOLE 100 MG PO TABS: 100.0000 mg | ORAL_TABLET | Freq: Every day | ORAL | 0 refills | Status: DC

## 2018-10-24 NOTE — Progress Notes (Addendum)
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  Interval Since Last Radiation:  4 weeks  07/28/18 - 09/22/18:   The prostate was treated to an initial dose of 45 Gy in 25 fractions of 1.8 Gy, followed by a boost of 30 Gy in 15 fractions of 2  Narrative:  The patient returns today for routine follow-up.  He tolerated radiation treatment relatively well.   He experienced some minor urinary irritation and modest fatigue.  He reported nocturia x2, post-void leakage, and occasional incomplete bladder emptying but denied hematuria, fatigue, hesitancy, straining, or urgency.Dan Farrell He reported dysuria around week 4 that continued through the end of treatment. He also noted weakening of his stream, with occasional intermittency, as treatments went on. By the end of treatments, he reported issues emptying his bladder, straining, and increased urinary frequency.                              On review of systems, the patient states that he is doing well overall.  He has noticed gradual improvement in his LUTS with a current IPSS score of 6, indicating mild symptoms only.  He specifically denies dysuria, gross hematuria, excessive daytime frequency, urgency, straining to void or incomplete emptying of his bladder.  He continues with nocturia x2 per night and a weakened stream but feels that this is gradually improving.  He denies abdominal pain, nausea, vomiting, diarrhea or constipation.  He reports a healthy appetite and is maintaining his weight.  He denies any significant fatigue/malaise and continues to tolerate the ADT well despite bothersome hot flashes.  He had a repeat Lupron  injection approximately 2 weeks ago with Dr. Louis Meckel.  Overall, he is quite pleased with his progress to date.  ALLERGIES:  has No Known Allergies.  Meds: Current Outpatient Medications  Medication Sig Dispense Refill  . abiraterone acetate (ZYTIGA) 250 MG tablet TAKE 4 TABLETS BY MOUTH DAILY. TAKE ON AN EMPTY STOMACH 1 HOUR BEFOREOR 2 HOURS AFTER A MEAL. 120 tablet 0  . calcium-vitamin D (OSCAL WITH D) 500-200 MG-UNIT tablet Take 1 tablet by mouth 2 (two) times daily. 90 tablet 3  . fenofibrate 160 MG tablet TAKE 1 TABLET BY MOUTH ONCE A DAY FOR CHOLESTEROL  3  . Multiple Vitamin (MULTIVITAMIN) tablet Take 1 tablet by mouth daily.    . naproxen sodium (ANAPROX) 220 MG tablet Take 220 mg by mouth 2 (two) times daily with a meal.    . predniSONE (DELTASONE) 5 MG tablet Take 1 tablet (5 mg total) by mouth daily with breakfast. 90 tablet 3  . valsartan-hydrochlorothiazide (DIOVAN-HCT) 160-12.5 MG tablet Take 1 tablet by mouth daily.  0  . fluconazole (DIFLUCAN) 100 MG tablet Take 1 tablet (100 mg total) by mouth daily. 5 tablet 0  . SUMAtriptan Succinate (ZEMBRACE SYMTOUCH) 3 MG/0.5ML SOAJ Inject 3 mg into the skin as directed. At earliest onset, may repeat in 2 hours if headache persists or reoccurs (Patient not taking: Reported on 10/22/2018) 4.5 mL 3   No current facility-administered medications for this encounter.     Physical Findings:  height is _0  (  1.778 m) and weight is 179 lb 3.2 oz (81.3 kg). His oral temperature is 98.9 F (37.2 C). His blood pressure is 121/80 and his pulse is 91. His respiration is 20 and oxygen saturation is 98%.  Pain Assessment Pain Score: 1  Pain Loc: Mouth/10 In general this is a well appearing Caucasian male in no acute distress.  He's alert and oriented x4 and appropriate throughout the examination. Cardiopulmonary assessment is negative for acute distress and he exhibits normal effort.   Lab Findings: Lab Results  Component Value Date   WBC 2.6  (L) 10/02/2018   HGB 11.9 (L) 10/02/2018   HCT 34.6 (L) 10/02/2018   MCV 98.9 10/02/2018   PLT 180 10/02/2018     Radiographic Findings: No results found.  Impression/Plan: 1. 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 He will continue to follow up with urology for ongoing PSA determinations and has an appointment scheduled with Dr. Louis Meckel in April 2020 for repeat Lupron injection.  He anticipates completing a full 2-year course of ADT.  He understands what to expect with regards to PSA monitoring going forward. I will look forward to following his response to treatment via correspondence with urology, and would be happy to continue to participate in his care if clinically indicated. I talked to the patient about what to expect in the future, including his risk for erectile dysfunction and rectal bleeding.  He is scheduled for colonoscopy on 11/04/2019 which he insists on keeping because he is already met his out-of-pocket deductible for the year.  I advised him to inform his gastroenterologist that he has had recent radiation to the pelvic region.  I encouraged him to call or return to the office if he has any questions regarding his previous radiation or possible radiation side effects. He was comfortable with this plan and will follow up as needed.    Dan Johns, PA-C

## 2018-11-19 ENCOUNTER — Other Ambulatory Visit: Payer: Self-pay | Admitting: Oncology

## 2018-11-19 DIAGNOSIS — C61 Malignant neoplasm of prostate: Secondary | ICD-10-CM

## 2018-11-25 ENCOUNTER — Inpatient Hospital Stay: Payer: BC Managed Care – PPO | Attending: Oncology

## 2018-11-25 ENCOUNTER — Inpatient Hospital Stay: Payer: BC Managed Care – PPO

## 2018-11-25 ENCOUNTER — Inpatient Hospital Stay (HOSPITAL_BASED_OUTPATIENT_CLINIC_OR_DEPARTMENT_OTHER): Payer: BC Managed Care – PPO | Admitting: Oncology

## 2018-11-25 ENCOUNTER — Telehealth: Payer: Self-pay

## 2018-11-25 VITALS — BP 135/89 | HR 82 | Temp 97.8°F | Resp 18 | Ht 70.0 in | Wt 178.0 lb

## 2018-11-25 DIAGNOSIS — Z79899 Other long term (current) drug therapy: Secondary | ICD-10-CM | POA: Insufficient documentation

## 2018-11-25 DIAGNOSIS — C61 Malignant neoplasm of prostate: Secondary | ICD-10-CM | POA: Insufficient documentation

## 2018-11-25 DIAGNOSIS — Z923 Personal history of irradiation: Secondary | ICD-10-CM

## 2018-11-25 DIAGNOSIS — I10 Essential (primary) hypertension: Secondary | ICD-10-CM

## 2018-11-25 DIAGNOSIS — C775 Secondary and unspecified malignant neoplasm of intrapelvic lymph nodes: Secondary | ICD-10-CM | POA: Diagnosis not present

## 2018-11-25 DIAGNOSIS — E876 Hypokalemia: Secondary | ICD-10-CM | POA: Insufficient documentation

## 2018-11-25 LAB — CMP (CANCER CENTER ONLY)
ALK PHOS: 49 U/L (ref 38–126)
ALT: 13 U/L (ref 0–44)
ANION GAP: 10 (ref 5–15)
AST: 19 U/L (ref 15–41)
Albumin: 3.8 g/dL (ref 3.5–5.0)
BUN: 10 mg/dL (ref 6–20)
CO2: 26 mmol/L (ref 22–32)
Calcium: 9.3 mg/dL (ref 8.9–10.3)
Chloride: 103 mmol/L (ref 98–111)
Creatinine: 0.88 mg/dL (ref 0.61–1.24)
GFR, Est AFR Am: >60 mL/min (ref >60)
GFR, Estimated: >60 mL/min (ref >60)
Glucose, Bld: 95 mg/dL (ref 70–99)
Potassium: 4.1 mmol/L (ref 3.5–5.1)
Sodium: 139 mmol/L (ref 135–145)
Total Bilirubin: 0.2 mg/dL — ABNORMAL LOW (ref 0.3–1.2)
Total Protein: 7.2 g/dL (ref 6.5–8.1)

## 2018-11-25 LAB — CBC WITH DIFFERENTIAL (CANCER CENTER ONLY)
Abs Immature Granulocytes: 0.02 10*3/uL (ref 0.00–0.07)
Basophils Absolute: 0 10*3/uL (ref 0.0–0.1)
Basophils Relative: 1 %
Eosinophils Absolute: 0.1 10*3/uL (ref 0.0–0.5)
Eosinophils Relative: 3 %
HCT: 35.2 % — ABNORMAL LOW (ref 39.0–52.0)
Hemoglobin: 12.4 g/dL — ABNORMAL LOW (ref 13.0–17.0)
Immature Granulocytes: 1 %
Lymphocytes Relative: 12 %
Lymphs Abs: 0.4 10*3/uL — ABNORMAL LOW (ref 0.7–4.0)
MCH: 34.4 pg — ABNORMAL HIGH (ref 26.0–34.0)
MCHC: 35.2 g/dL (ref 30.0–36.0)
MCV: 97.8 fL (ref 80.0–100.0)
Monocytes Absolute: 0.3 10*3/uL (ref 0.1–1.0)
Monocytes Relative: 9 %
Neutro Abs: 2.6 10*3/uL (ref 1.7–7.7)
Neutrophils Relative %: 74 %
Platelet Count: 191 10*3/uL (ref 150–400)
RBC: 3.6 MIL/uL — ABNORMAL LOW (ref 4.22–5.81)
RDW: 11.9 % (ref 11.5–15.5)
WBC Count: 3.5 10*3/uL — ABNORMAL LOW (ref 4.0–10.5)
nRBC: 0 % (ref 0.0–0.2)

## 2018-11-25 MED ORDER — DENOSUMAB 120 MG/1.7ML ~~LOC~~ SOLN
SUBCUTANEOUS | Status: AC
  Filled 2018-11-25: qty 1.7

## 2018-11-25 MED ORDER — DENOSUMAB 120 MG/1.7ML ~~LOC~~ SOLN
120.0000 mg | Freq: Once | SUBCUTANEOUS | Status: AC
  Administered 2018-11-25: 120 mg via SUBCUTANEOUS

## 2018-11-25 NOTE — Telephone Encounter (Signed)
Printed avs and calender of upcoming appointment. Per 1/14 los 

## 2018-11-25 NOTE — Progress Notes (Signed)
Hematology and Oncology Follow Up Visit  Dan Farrell [id number] [birth year] 58 y.o. 11/25/2018 1:33 PM Dan Farrell, Dan Farrell, MDEhinger, Dan [name], MD   Principle Diagnosis: 58 year old man with castration-sensitive prostate cancer presented with a PSA of 11 Gleason score 4+4 = 8 diagnosed in May 2019.    Prior Therapy:  Prostate biopsy obtained in May 2019.  Radiation therapy to the prostate completed and November 2019.  He received 45 Gy in 25 fractions followed by 30 Gy in 15 fractions  Current therapy:  Androgen deprivation therapy started on 05/19/2018.  He remains on Lupron 30 mg every 4 months.  Zytiga 1000 mg with prednisone 5 mg started in July 2019  Xgeva 120 mg every 8 weeks.  Interim History: Dan Farrell returns today for a repeat evaluation.  Since the last visit, he reports no major changes in his health.  Continues to tolerate Zytiga without any issues.  He denies any excessive fatigue, tiredness or lower extremity edema.  Continues to work full-time without any decline ability to do so.  He started [place] with the last visit and has not had any issues with it.  He denies any bone arthralgias or myalgias.  He does not report any headaches, blurry vision, syncope or seizures.  He denies any alteration mental status or dizziness.  Does not report any fevers, chills or sweats.  Does not report any cough, wheezing or hemoptysis.  Does not report any chest pain, palpitation, orthopnea or leg edema.  Does not report any nausea, vomiting or distention. Does not report any constipation or diarrhea. Does not report any bone pain or pathological fractures.   Does not report frequency, urgency or hematuria.  Does not report any bruising or bleeding.  Denies any anxiety or depression.   Remaining review of systems is negative.    Medications: I have reviewed the patient's current medications.  Current Outpatient Medications  Medication Sig Dispense Refill  . abiraterone acetate (ZYTIGA)  250 MG tablet TAKE 4 TABLETS BY MOUTH DAILY. TAKE ON AN EMPTY STOMACH 1 HOUR BEFOREOR 2 HOURS AFTER A MEAL. 120 tablet 0  . calcium-vitamin D (OSCAL WITH D) 500-200 MG-UNIT tablet Take 1 tablet by mouth 2 (two) times daily. 90 tablet 3  . fenofibrate 160 MG tablet TAKE 1 TABLET BY MOUTH ONCE A DAY FOR CHOLESTEROL  3  . fluconazole (DIFLUCAN) 100 MG tablet Take 1 tablet (100 mg total) by mouth daily. 5 tablet 0  . Multiple Vitamin (MULTIVITAMIN) tablet Take 1 tablet by mouth daily.    . naproxen sodium (ANAPROX) 220 MG tablet Take 220 mg by mouth 2 (two) times daily with a meal.    . predniSONE (DELTASONE) 5 MG tablet Take 1 tablet (5 mg total) by mouth daily with breakfast. 90 tablet 3  . SUMAtriptan Succinate (ZEMBRACE SYMTOUCH) 3 MG/0.5ML SOAJ Inject 3 mg into the skin as directed. At earliest onset, may repeat in 2 hours if headache persists or reoccurs (Patient not taking: Reported on 10/22/2018) 4.5 mL 3  . valsartan-hydrochlorothiazide (DIOVAN-HCT) 160-12.5 MG tablet Take 1 tablet by mouth daily.  0   No current facility-administered medications for this visit.      Allergies: No Known Allergies  Past Medical History, Surgical history, Social history, and Family History were reviewed and updated.    Physical Exam:  Blood pressure 135/89, pulse 82, temperature 97.8 F (36.6 C), temperature source Oral, resp. rate 18, height 5\' 10"  (1.778 m), weight 178 lb (80.7 kg), SpO2 97 %.  ECOG: 0   General appearance: Alert, awake without any distress. Head: Atraumatic without abnormalities Oropharynx: Without any thrush or ulcers. Eyes: No scleral icterus. Lymph nodes: No lymphadenopathy noted in the cervical, supraclavicular, or axillary nodes Heart:regular rate and rhythm, without any murmurs or gallops.   Lung: Clear to auscultation without any rhonchi, wheezes or dullness to percussion. Abdomin: Soft, nontender without any shifting dullness or ascites. Musculoskeletal: No  clubbing or cyanosis. Neurological: No motor or sensory deficits. Skin: No rashes or lesions.   Results for Dan Farrell, Dan Farrell (MRN [id number]) as of 11/25/2018 13:37  Ref. Range 08/20/2018 13:29 10/02/2018 14:01  Prostate Specific Ag, Serum Latest Ref Range: 0.0 - 4.0 ng/mL 0.1 <0.1     Impression and Plan:    58 year old man with:  1.    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  He is currently on Zytiga with prednisone without any major complications.  His PSA continues to be and detectable.  Risks and benefits of continuing this therapy for the time being was reviewed.  Complications associated with this medication long-term including hypertension, edema and renal insufficiency.  The duration of therapy was discussed today and debated.  The recommendation is to continue with Zytiga until disease progression but we will address this after 2 years of therapy with option of discontinuation at the time.     2.  Androgen deprivation: Risks and benefits of continuing Lupron was reviewed today.  Long-term complications including osteoporosis among others.  He is agreeable to continue and will receive the next treatment in March 2020.  3.  Bone directed therapy: No complications related to Xgeva noted.  Long-term issues such as osteonecrosis of the jaw and hypocalcemia were discussed agreeable to continue.  4.  Liver function test surveillance: AST and ALT continue to be monitored and within normal range as of November 2019.  5.  Hypokalemia: No issues with his potassium noted at this time will continue to be monitored.  6.  Hypertension: No issues reported with his blood pressure on Zytiga.  This will be monitored periodically.  7.  Prognosis: Therapy remains palliative although aggressive therapy is warranted given his excellent performance status and young age.  8.  Follow-up: We will be in 2 months to follow his  progress.  25 minutes was spent with the patient face-to-face today.  More than 50% of time was dedicated to discussing his disease status, treatment options and answering questions regarding future plan of care.     Zola Button, MD 1/14/20201:33 PM

## 2018-11-26 ENCOUNTER — Telehealth: Payer: Self-pay | Admitting: *Deleted

## 2018-11-26 LAB — PROSTATE-SPECIFIC AG, SERUM (LABCORP): Prostate Specific Ag, Serum: <0.1 ng/mL (ref 0.0–4.0)

## 2018-11-26 NOTE — Telephone Encounter (Signed)
Spoke with patient, gave results of last PSA 

## 2018-11-26 NOTE — Telephone Encounter (Signed)
-----   Message from Wyatt Portela, MD sent at 11/26/2018  7:59 AM EST ----- Please let him know his PSA still very low.

## 2019-01-16 ENCOUNTER — Other Ambulatory Visit: Payer: Self-pay | Admitting: Oncology

## 2019-01-16 DIAGNOSIS — C61 Malignant neoplasm of prostate: Secondary | ICD-10-CM

## 2019-01-27 ENCOUNTER — Other Ambulatory Visit: Payer: Self-pay

## 2019-01-27 ENCOUNTER — Inpatient Hospital Stay: Payer: BC Managed Care – PPO

## 2019-01-27 ENCOUNTER — Telehealth: Payer: Self-pay | Admitting: Oncology

## 2019-01-27 ENCOUNTER — Inpatient Hospital Stay: Payer: BC Managed Care – PPO | Attending: Oncology | Admitting: Oncology

## 2019-01-27 VITALS — BP 118/79 | HR 83 | Temp 98.7°F | Resp 18 | Ht 70.0 in | Wt 181.6 lb

## 2019-01-27 DIAGNOSIS — Z79899 Other long term (current) drug therapy: Secondary | ICD-10-CM | POA: Diagnosis not present

## 2019-01-27 DIAGNOSIS — Z923 Personal history of irradiation: Secondary | ICD-10-CM | POA: Insufficient documentation

## 2019-01-27 DIAGNOSIS — C61 Malignant neoplasm of prostate: Secondary | ICD-10-CM | POA: Diagnosis not present

## 2019-01-27 DIAGNOSIS — C7951 Secondary malignant neoplasm of bone: Secondary | ICD-10-CM | POA: Diagnosis not present

## 2019-01-27 LAB — CBC WITH DIFFERENTIAL (CANCER CENTER ONLY)
Abs Immature Granulocytes: 0.01 10*3/uL (ref 0.00–0.07)
Basophils Absolute: 0 10*3/uL (ref 0.0–0.1)
Basophils Relative: 1 %
EOS PCT: 4 %
Eosinophils Absolute: 0.1 10*3/uL (ref 0.0–0.5)
HCT: 35.3 % — ABNORMAL LOW (ref 39.0–52.0)
Hemoglobin: 12.1 g/dL — ABNORMAL LOW (ref 13.0–17.0)
Immature Granulocytes: 0 %
LYMPHS PCT: 14 %
Lymphs Abs: 0.5 10*3/uL — ABNORMAL LOW (ref 0.7–4.0)
MCH: 33.7 pg (ref 26.0–34.0)
MCHC: 34.3 g/dL (ref 30.0–36.0)
MCV: 98.3 fL (ref 80.0–100.0)
Monocytes Absolute: 0.3 10*3/uL (ref 0.1–1.0)
Monocytes Relative: 10 %
Neutro Abs: 2.5 10*3/uL (ref 1.7–7.7)
Neutrophils Relative %: 71 %
Platelet Count: 202 10*3/uL (ref 150–400)
RBC: 3.59 MIL/uL — ABNORMAL LOW (ref 4.22–5.81)
RDW: 12.1 % (ref 11.5–15.5)
WBC: 3.5 10*3/uL — AB (ref 4.0–10.5)
nRBC: 0 % (ref 0.0–0.2)

## 2019-01-27 LAB — CMP (CANCER CENTER ONLY)
ALT: 15 U/L (ref 0–44)
AST: 19 U/L (ref 15–41)
Albumin: 3.8 g/dL (ref 3.5–5.0)
Alkaline Phosphatase: 47 U/L (ref 38–126)
Anion gap: 12 (ref 5–15)
BUN: 14 mg/dL (ref 6–20)
CHLORIDE: 102 mmol/L (ref 98–111)
CO2: 24 mmol/L (ref 22–32)
Calcium: 9.5 mg/dL (ref 8.9–10.3)
Creatinine: 0.92 mg/dL (ref 0.61–1.24)
GFR, Est AFR Am: >60 mL/min (ref >60)
GFR, Estimated: >60 mL/min (ref >60)
Glucose, Bld: 97 mg/dL (ref 70–99)
POTASSIUM: 3.4 mmol/L — AB (ref 3.5–5.1)
Sodium: 138 mmol/L (ref 135–145)
Total Bilirubin: 0.3 mg/dL (ref 0.3–1.2)
Total Protein: 7.2 g/dL (ref 6.5–8.1)

## 2019-01-27 MED ORDER — CELECOXIB 50 MG PO CAPS: 50.0000 mg | ORAL_CAPSULE | Freq: Every day | ORAL | 0 refills | Status: AC

## 2019-01-27 MED ORDER — DENOSUMAB 120 MG/1.7ML ~~LOC~~ SOLN
120.0000 mg | Freq: Once | SUBCUTANEOUS | Status: AC
  Administered 2019-01-27: 120 mg via SUBCUTANEOUS

## 2019-01-27 MED ORDER — DENOSUMAB 120 MG/1.7ML ~~LOC~~ SOLN
SUBCUTANEOUS | Status: AC
  Filled 2019-01-27: qty 1.7

## 2019-01-27 MED ORDER — LEUPROLIDE ACETATE (4 MONTH) 30 MG IM KIT
30.0000 mg | PACK | Freq: Once | INTRAMUSCULAR | Status: AC
  Administered 2019-01-27: 30 mg via INTRAMUSCULAR
  Filled 2019-01-27: qty 30

## 2019-01-27 NOTE — Progress Notes (Signed)
Hematology and Oncology Follow Up Visit  Dan Farrell [id number] [birth year] 58 y.o. 01/27/2019 1:04 PM Dan Farrell, MDEhinger, [name], MD   Principle Diagnosis: 58 year old man with advanced prostate cancer diagnosed in May 2019.  He presented with castration-sensitive disease, PSA of 11 Gleason score 4+4 = 8 and bone metastasis.   Prior Therapy:  Prostate biopsy obtained in May 2019.  Radiation therapy to the prostate completed and November 2019.  He received 45 Gy in 25 fractions followed by 30 Gy in 15 fractions  Current therapy:   He remains on Lupron 30 mg every 4 months started in July 2019.  Zytiga 1000 mg with prednisone 5 mg started in July 2019  Xgeva 120 mg every 8 weeks.  Interim History: Dan Farrell is here for a repeat evaluation.  Since the last visit, he reports no major changes in his health.  He has reported intermittent right hip pain especially noted after he has been working all day on on his feet.  At times he has reported some occasional numbness shooting down to the bottom of his foot.  He denies any weakness or unsteadiness.  He still able to ambulate without any difficulties.  For the most part the pain is not noticeable today.  He has reported history of sciatica in the past that responds anti-inflammatory medication.  He continues to tolerate Zytiga without any new complications.  He denies any worsening edema or hospitalizations.  He denies any excessive fatigue or tiredness.  Patient denied any alteration mental status, neuropathy, confusion or dizziness.  Denies any headaches or lethargy.  Denies any night sweats, weight loss or changes in appetite.  Denied orthopnea, dyspnea on exertion or chest discomfort.  Denies shortness of breath, difficulty breathing hemoptysis or cough.  Denies any abdominal distention, nausea, early satiety or dyspepsia.  Denies any hematuria, frequency, dysuria or nocturia.  Denies any skin irritation, dryness or rash.   Denies any ecchymosis or petechiae.  Denies any lymphadenopathy or clotting.  Denies any heat or cold intolerance.  Denies any anxiety or depression.  Remaining review of system is negative.      Medications: I have reviewed the patient's current medications.  Current Outpatient Medications  Medication Sig Dispense Refill  . abiraterone acetate (ZYTIGA) 250 MG tablet TAKE 4 TABLETS BY MOUTH DAILY. TAKE ON AN EMPTY STOMACH 1 HOUR BEFOREOR 2 HOURS AFTER A MEAL. 120 tablet 0  . calcium-vitamin D (OSCAL WITH D) 500-200 MG-UNIT tablet Take 1 tablet by mouth 2 (two) times daily. 90 tablet 3  . fenofibrate 160 MG tablet TAKE 1 TABLET BY MOUTH ONCE A DAY FOR CHOLESTEROL  3  . fluconazole (DIFLUCAN) 100 MG tablet Take 1 tablet (100 mg total) by mouth daily. 5 tablet 0  . Multiple Vitamin (MULTIVITAMIN) tablet Take 1 tablet by mouth daily.    . naproxen sodium (ANAPROX) 220 MG tablet Take 220 mg by mouth 2 (two) times daily with a meal.    . predniSONE (DELTASONE) 5 MG tablet Take 1 tablet (5 mg total) by mouth daily with breakfast. 90 tablet 3  . SUMAtriptan Succinate (ZEMBRACE SYMTOUCH) 3 MG/0.5ML SOAJ Inject 3 mg into the skin as directed. At earliest onset, may repeat in 2 hours if headache persists or reoccurs (Patient not taking: Reported on 10/22/2018) 4.5 mL 3  . valsartan-hydrochlorothiazide (DIOVAN-HCT) 160-12.5 MG tablet Take 1 tablet by mouth daily.  0   No current facility-administered medications for this visit.      Allergies: No Known  Allergies  Past Medical History, Surgical history, Social history, and Family History were reviewed and updated.    Physical Exam:  Blood pressure 118/79, pulse 83, temperature 98.7 F (37.1 C), temperature source Oral, resp. rate 18, height 5\' 10"  (1.778 m), weight 181 lb 9.6 oz (82.4 kg), SpO2 99 %.    ECOG: 0   General appearance: Comfortable appearing without any discomfort Head: Normocephalic without any trauma Oropharynx: Mucous  membranes are moist and pink without any thrush or ulcers. Eyes: Pupils are equal and round reactive to light. Lymph nodes: No cervical, supraclavicular, inguinal or axillary lymphadenopathy.   Heart:regular rate and rhythm.  S1 and S2 without leg edema. Lung: Clear without any rhonchi or wheezes.  No dullness to percussion. Abdomin: Soft, nontender, nondistended with good bowel sounds.  No hepatosplenomegaly. Musculoskeletal: No joint deformity or effusion.  Full range of motion noted. Neurological: Normal exam without any decreased motor strength in his lower extremity.  No sensory levels noted.  Ambulating without any issues. Skin: No petechial rash or dryness.  Appeared moist.    Results for Dan Farrell, Dan Farrell (MRN [id number]) as of 01/27/2019 13:04  Ref. Range 10/02/2018 14:01 11/25/2018 13:23  Prostate Specific Ag, Serum Latest Ref Range: 0.0 - 4.0 ng/mL <0.1 <0.1     Impression and Plan:    58 year old man with:  1.    Castration-sensitive advanced prostate cancer with disease to the bone.  He has tolerated Zytiga without any major complications and his PSA remains undetectable.  Risks and benefits of continuing this treatment at this time was discussed.  Long-term complications were reviewed which include edema, hypertension and adrenal insufficiency.  Alternative treatment options were also reviewed today.  The plan is to restaging with a CT scan and a bone scan to update his disease status.  He also understands despite the low PSA there is a small chance of poorly differentiated tumor could spread without repeating imaging studies.     2.  Androgen deprivation: He remains on Lupron every 4 months which she will receive today.  Long-term complications of androgen deprivation was reiterated.  These were include weight gain, hot flashes and sexual dysfunction.  Is agreeable to continue at this time.  3.  Bone directed therapy: He has tolerated Xgeva without any issues.  Plan is  to continue for bony protective purposes.  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  He is agreeable to continue.  4.  Liver function test surveillance: Liver function test will continue to be monitored on Zytiga.  No issues noted.  5.  Hypokalemia: His potassium continues to be within normal range and will be monitored periodically.  6.  Hypertension: Blood pressure remains within normal range without any issues at this time.  7.  Prognosis: Treatment remains palliative although his performance status is excellent and aggressive therapy is warranted.  8.  Back pain: His pain is predominantly in the right lower back and appears to be related to disc disease.  Metastatic malignancy that has progressed is also a possibility.  We will give a short course of Celebrex with consideration for MRI if his pain persists.  His pain has been intermittent and mild in nature.  9.  Follow-up: In 2 months for a repeat evaluation and Xgeva injection.  25 minutes was spent with the patient face-to-face today.  More than 50% of time was spent on reviewing his disease status, laboratory data, management options and dealing with complications related to  therapy and discussing plan of care.     Zola Button, MD 3/17/20201:04 PM

## 2019-01-27 NOTE — Telephone Encounter (Signed)
Scheduled appt per 3/17 los.  Gave patient contrast and number to central radiology.  Printed calendar and avs.

## 2019-01-27 NOTE — Patient Instructions (Signed)
Denosumab injection What is this medicine? DENOSUMAB (den oh sue mab) slows bone breakdown. Prolia is used to treat osteoporosis in women after menopause and in men, and in people who are taking corticosteroids for 6 months or more. Xgeva is used to treat a high calcium level due to cancer and to prevent bone fractures and other bone problems caused by multiple myeloma or cancer bone metastases. Xgeva is also used to treat giant cell tumor of the bone. This medicine may be used for other purposes; ask your health care provider or pharmacist if you have questions. COMMON BRAND NAME(S): Prolia, XGEVA What should I tell my health care provider before I take this medicine? They need to know if you have any of these conditions: -dental disease -having surgery or tooth extraction -infection -kidney disease -low levels of calcium or Vitamin D in the blood -malnutrition -on hemodialysis -skin conditions or sensitivity -thyroid or parathyroid disease -an unusual reaction to denosumab, other medicines, foods, dyes, or preservatives -pregnant or trying to get pregnant -breast-feeding How should I use this medicine? This medicine is for injection under the skin. It is given by a health care professional in a hospital or clinic setting. A special MedGuide will be given to you before each treatment. Be sure to read this information carefully each time. For Prolia, talk to your pediatrician regarding the use of this medicine in children. Special care may be needed. For Xgeva, talk to your pediatrician regarding the use of this medicine in children. While this drug may be prescribed for children as young as 13 years for selected conditions, precautions do apply. Overdosage: If you think you have taken too much of this medicine contact a poison control center or emergency room at once. NOTE: This medicine is only for you. Do not share this medicine with others. What if I miss a dose? It is important not to  miss your dose. Call your doctor or health care professional if you are unable to keep an appointment. What may interact with this medicine? Do not take this medicine with any of the following medications: -other medicines containing denosumab This medicine may also interact with the following medications: -medicines that lower your chance of fighting infection -steroid medicines like prednisone or cortisone This list may not describe all possible interactions. Give your health care provider a list of all the medicines, herbs, non-prescription drugs, or dietary supplements you use. Also tell them if you smoke, drink alcohol, or use illegal drugs. Some items may interact with your medicine. What should I watch for while using this medicine? Visit your doctor or health care professional for regular checks on your progress. Your doctor or health care professional may order blood tests and other tests to see how you are doing. Call your doctor or health care professional for advice if you get a fever, chills or sore throat, or other symptoms of a cold or flu. Do not treat yourself. This drug may decrease your body's ability to fight infection. Try to avoid being around people who are sick. You should make sure you get enough calcium and vitamin D while you are taking this medicine, unless your doctor tells you not to. Discuss the foods you eat and the vitamins you take with your health care professional. See your dentist regularly. Brush and floss your teeth as directed. Before you have any dental work done, tell your dentist you are receiving this medicine. Do not become pregnant while taking this medicine or for 5 months   after stopping it. Talk with your doctor or health care professional about your birth control options while taking this medicine. Women should inform their doctor if they wish to become pregnant or think they might be pregnant. There is a potential for serious side effects to an unborn  child. Talk to your health care professional or pharmacist for more information. What side effects may I notice from receiving this medicine? Side effects that you should report to your doctor or health care professional as soon as possible: -allergic reactions like skin rash, itching or hives, swelling of the face, lips, or tongue -bone pain -breathing problems -dizziness -jaw pain, especially after dental work -redness, blistering, peeling of the skin -signs and symptoms of infection like fever or chills; cough; sore throat; pain or trouble passing urine -signs of low calcium like fast heartbeat, muscle cramps or muscle pain; pain, tingling, numbness in the hands or feet; seizures -unusual bleeding or bruising -unusually weak or tired Side effects that usually do not require medical attention (report to your doctor or health care professional if they continue or are bothersome): -constipation -diarrhea -headache -joint pain -loss of appetite -muscle pain -runny nose -tiredness -upset stomach This list may not describe all possible side effects. Call your doctor for medical advice about side effects. You may report side effects to FDA at 1-800-FDA-1088. Where should I keep my medicine? This medicine is only given in a clinic, doctor's office, or other health care setting and will not be stored at home. NOTE: This sheet is a summary. It may not cover all possible information. If you have questions about this medicine, talk to your doctor, pharmacist, or health care provider.  2019 Elsevier/Gold Standard (2018-03-07 16:10:44)  Leuprolide injection What is this medicine? LEUPROLIDE (loo PROE lide) is a man-made hormone. It is used to treat the symptoms of prostate cancer. This medicine may also be used to treat children with early onset of puberty. It may be used for other hormonal conditions. This medicine may be used for other purposes; ask your health care provider or pharmacist if  you have questions. COMMON BRAND NAME(S): Lupron What should I tell my health care provider before I take this medicine? They need to know if you have any of these conditions: -diabetes -heart disease or previous heart attack -high blood pressure -high cholesterol -pain or difficulty passing urine -spinal cord metastasis -stroke -tobacco smoker -an unusual or allergic reaction to leuprolide, benzyl alcohol, other medicines, foods, dyes, or preservatives -pregnant or trying to get pregnant -breast-feeding How should I use this medicine? This medicine is for injection under the skin or into a muscle. You will be taught how to prepare and give this medicine. Use exactly as directed. Take your medicine at regular intervals. Do not take your medicine more often than directed. It is important that you put your used needles and syringes in a special sharps container. Do not put them in a trash can. If you do not have a sharps container, call your pharmacist or healthcare provider to get one. A special MedGuide will be given to you by the pharmacist with each prescription and refill. Be sure to read this information carefully each time. Talk to your pediatrician regarding the use of this medicine in children. While this medicine may be prescribed for children as young as 8 years for selected conditions, precautions do apply. Overdosage: If you think you have taken too much of this medicine contact a poison control center or emergency room at   NOTE: This medicine is only for you. Do not share this medicine with others. What if I miss a dose? If you miss a dose, take it as soon as you can. If it is almost time for your next dose, take only that dose. Do not take double or extra doses. What may interact with this medicine? Do not take this medicine with any of the following medications: -chasteberry This medicine may also interact with the following medications: -herbal or dietary supplements,  like black cohosh or DHEA -male hormones, like estrogens or progestins and birth control pills, patches, rings, or injections -male hormones, like testosterone This list may not describe all possible interactions. Give your health care provider a list of all the medicines, herbs, non-prescription drugs, or dietary supplements you use. Also tell them if you smoke, drink alcohol, or use illegal drugs. Some items may interact with your medicine. What should I watch for while using this medicine? Visit your doctor or health care professional for regular checks on your progress. During the first week, your symptoms may get worse, but then will improve as you continue your treatment. You may get hot flashes, increased bone pain, increased difficulty passing urine, or an aggravation of nerve symptoms. Discuss these effects with your doctor or health care professional, some of them may improve with continued use of this medicine. Male patients may experience a menstrual cycle or spotting during the first 2 months of therapy with this medicine. If this continues, contact your doctor or health care professional. What side effects may I notice from receiving this medicine? Side effects that you should report to your doctor or health care professional as soon as possible: -allergic reactions like skin rash, itching or hives, swelling of the face, lips, or tongue -breathing problems -chest pain -depression or memory disorders -pain in your legs or groin -pain at site where injected -severe headache -swelling of the feet and legs -visual changes -vomiting Side effects that usually do not require medical attention (report to your doctor or health care professional if they continue or are bothersome): -breast swelling or tenderness -decrease in sex drive or performance -diarrhea -hot flashes -loss of appetite -muscle, joint, or bone pains -nausea -redness or irritation at site where injected -skin  problems or acne This list may not describe all possible side effects. Call your doctor for medical advice about side effects. You may report side effects to FDA at 1-800-FDA-1088. Where should I keep my medicine? Keep out of the reach of children. Store below 25 degrees C (77 degrees F). Do not freeze. Protect from light. Do not use if it is not clear or if there are particles present. Throw away any unused medicine after the expiration date. NOTE: This sheet is a summary. It may not cover all possible information. If you have questions about this medicine, talk to your doctor, pharmacist, or health care provider.  2019 Elsevier/Gold Standard (2016-06-18 10:54:35)

## 2019-01-28 ENCOUNTER — Telehealth: Payer: Self-pay | Admitting: *Deleted

## 2019-01-28 LAB — PROSTATE-SPECIFIC AG, SERUM (LABCORP): Prostate Specific Ag, Serum: <0.1 ng/mL (ref 0.0–4.0)

## 2019-01-28 NOTE — Telephone Encounter (Signed)
-----   Message from Wyatt Portela, MD sent at 01/28/2019  8:06 AM EDT ----- Please let him know his PSA is still low.

## 2019-01-28 NOTE — Telephone Encounter (Signed)
Per Dr Alen Blew ok to relay PSA results.  Spoke with patient.  No further questions at this time.

## 2019-03-13 ENCOUNTER — Telehealth: Payer: Self-pay

## 2019-03-13 NOTE — Telephone Encounter (Signed)
Received call from patient questioning the cancellation of the CT scan. Explained that the scan had been denied by insurance and Dr. Alen Blew is aware. Then rescheduled patient lab appointment prior to bone scan to reduce the patient visits. Patient verbalized understanding and had no other questions or concerns.

## 2019-03-17 ENCOUNTER — Encounter (HOSPITAL_COMMUNITY)
Admission: RE | Admit: 2019-03-17 | Discharge: 2019-03-17 | Disposition: A | Discharge status: Home / Self Care | Payer: BC Managed Care – PPO | Source: Ambulatory Visit | Attending: Oncology | Admitting: Oncology

## 2019-03-17 ENCOUNTER — Other Ambulatory Visit: Payer: Self-pay

## 2019-03-17 ENCOUNTER — Inpatient Hospital Stay: Payer: BC Managed Care – PPO | Attending: Oncology

## 2019-03-17 DIAGNOSIS — C61 Malignant neoplasm of prostate: Secondary | ICD-10-CM | POA: Diagnosis not present

## 2019-03-17 DIAGNOSIS — C7951 Secondary malignant neoplasm of bone: Secondary | ICD-10-CM | POA: Insufficient documentation

## 2019-03-17 DIAGNOSIS — M549 Dorsalgia, unspecified: Secondary | ICD-10-CM | POA: Insufficient documentation

## 2019-03-17 DIAGNOSIS — Z79899 Other long term (current) drug therapy: Secondary | ICD-10-CM | POA: Insufficient documentation

## 2019-03-17 DIAGNOSIS — I10 Essential (primary) hypertension: Secondary | ICD-10-CM | POA: Insufficient documentation

## 2019-03-17 DIAGNOSIS — Z923 Personal history of irradiation: Secondary | ICD-10-CM | POA: Insufficient documentation

## 2019-03-17 LAB — CMP (CANCER CENTER ONLY)
ALT: 11 U/L (ref 0–44)
AST: 17 U/L (ref 15–41)
Albumin: 3.8 g/dL (ref 3.5–5.0)
Alkaline Phosphatase: 44 U/L (ref 38–126)
Anion gap: 10 (ref 5–15)
BUN: 14 mg/dL (ref 6–20)
CO2: 24 mmol/L (ref 22–32)
Calcium: 9.2 mg/dL (ref 8.9–10.3)
Chloride: 104 mmol/L (ref 98–111)
Creatinine: 0.93 mg/dL (ref 0.61–1.24)
GFR, Est AFR Am: >60 mL/min (ref >60)
GFR, Estimated: >60 mL/min (ref >60)
Glucose, Bld: 98 mg/dL (ref 70–99)
Potassium: 4.4 mmol/L (ref 3.5–5.1)
Sodium: 138 mmol/L (ref 135–145)
Total Bilirubin: 0.3 mg/dL (ref 0.3–1.2)
Total Protein: 7.1 g/dL (ref 6.5–8.1)

## 2019-03-17 LAB — CBC WITH DIFFERENTIAL (CANCER CENTER ONLY)
Abs Immature Granulocytes: 0.01 10*3/uL (ref 0.00–0.07)
Basophils Absolute: 0 10*3/uL (ref 0.0–0.1)
Basophils Relative: 1 %
Eosinophils Absolute: 0.1 10*3/uL (ref 0.0–0.5)
Eosinophils Relative: 3 %
HCT: 34.6 % — ABNORMAL LOW (ref 39.0–52.0)
Hemoglobin: 11.8 g/dL — ABNORMAL LOW (ref 13.0–17.0)
Immature Granulocytes: 0 %
Lymphocytes Relative: 11 %
Lymphs Abs: 0.4 10*3/uL — ABNORMAL LOW (ref 0.7–4.0)
MCH: 33.3 pg (ref 26.0–34.0)
MCHC: 34.1 g/dL (ref 30.0–36.0)
MCV: 97.7 fL (ref 80.0–100.0)
Monocytes Absolute: 0.4 10*3/uL (ref 0.1–1.0)
Monocytes Relative: 10 %
Neutro Abs: 3.2 10*3/uL (ref 1.7–7.7)
Neutrophils Relative %: 75 %
Platelet Count: 188 10*3/uL (ref 150–400)
RBC: 3.54 MIL/uL — ABNORMAL LOW (ref 4.22–5.81)
RDW: 12.3 % (ref 11.5–15.5)
WBC Count: 4.2 10*3/uL (ref 4.0–10.5)
nRBC: 0 % (ref 0.0–0.2)

## 2019-03-17 MED ORDER — TECHNETIUM TC 99M MEDRONATE IV KIT
20.0000 | PACK | Freq: Once | INTRAVENOUS | Status: AC | PRN
  Administered 2019-03-17: 19.1 via INTRAVENOUS

## 2019-03-18 ENCOUNTER — Ambulatory Visit (HOSPITAL_COMMUNITY): Admission: RE | Admit: 2019-03-18 | Payer: BC Managed Care – PPO | Source: Ambulatory Visit

## 2019-03-18 ENCOUNTER — Other Ambulatory Visit: Payer: BC Managed Care – PPO

## 2019-03-18 ENCOUNTER — Other Ambulatory Visit: Payer: Self-pay | Admitting: Oncology

## 2019-03-18 DIAGNOSIS — C61 Malignant neoplasm of prostate: Secondary | ICD-10-CM

## 2019-03-18 LAB — PROSTATE-SPECIFIC AG, SERUM (LABCORP): Prostate Specific Ag, Serum: <0.1 ng/mL (ref 0.0–4.0)

## 2019-03-25 ENCOUNTER — Inpatient Hospital Stay (HOSPITAL_BASED_OUTPATIENT_CLINIC_OR_DEPARTMENT_OTHER): Payer: BC Managed Care – PPO | Admitting: Oncology

## 2019-03-25 ENCOUNTER — Inpatient Hospital Stay: Payer: BC Managed Care – PPO

## 2019-03-25 ENCOUNTER — Other Ambulatory Visit: Payer: Self-pay

## 2019-03-25 VITALS — BP 133/84 | HR 88 | Temp 98.9°F | Resp 18 | Ht 70.0 in | Wt 180.2 lb

## 2019-03-25 DIAGNOSIS — C61 Malignant neoplasm of prostate: Secondary | ICD-10-CM

## 2019-03-25 DIAGNOSIS — Z79899 Other long term (current) drug therapy: Secondary | ICD-10-CM | POA: Diagnosis not present

## 2019-03-25 DIAGNOSIS — Z923 Personal history of irradiation: Secondary | ICD-10-CM

## 2019-03-25 DIAGNOSIS — I10 Essential (primary) hypertension: Secondary | ICD-10-CM | POA: Diagnosis not present

## 2019-03-25 DIAGNOSIS — C7951 Secondary malignant neoplasm of bone: Secondary | ICD-10-CM

## 2019-03-25 DIAGNOSIS — M549 Dorsalgia, unspecified: Secondary | ICD-10-CM | POA: Diagnosis not present

## 2019-03-25 MED ORDER — LEUPROLIDE ACETATE (4 MONTH) 30 MG IM KIT: 30.0000 mg | PACK | Freq: Once | INTRAMUSCULAR | Status: DC

## 2019-03-25 MED ORDER — DENOSUMAB 120 MG/1.7ML ~~LOC~~ SOLN
120.0000 mg | Freq: Once | SUBCUTANEOUS | Status: AC
  Administered 2019-03-25: 120 mg via SUBCUTANEOUS

## 2019-03-25 MED ORDER — DENOSUMAB 120 MG/1.7ML ~~LOC~~ SOLN
SUBCUTANEOUS | Status: AC
  Filled 2019-03-25: qty 1.7

## 2019-03-25 NOTE — Progress Notes (Signed)
Hematology and Oncology Follow Up Visit  Dan Farrell [id number] 23-Oct-1961 58 y.o. 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  He was found to have PSA of 11 Gleason score 4+4 = 8 in May 2019.   Prior Therapy:  Prostate biopsy obtained in May 2019.  Radiation therapy to the prostate completed and November 2019.  He received 45 Gy in 25 fractions followed by 30 Gy in 15 fractions  Current therapy:   He remains on Lupron 30 mg every 4 months started in July 2019.  Zytiga 1000 mg with prednisone 5 mg started in July 2019  Xgeva 120 mg every 8 weeks.  Interim History: Mr. Dan Farrell is here for a follow-up.  Since last visit, he reports no major changes in his health.  He reports his back pain has improved after taking few days of Celebrex.  Continues to be active and attends activities of daily living.  His performance status quality of life remains excellent.  He continues to tolerate Zytiga without any recent complaints.  He denies excessive fatigue or tiredness.  He denies any lower extremity edema.  He denied headaches, blurry vision, syncope or seizures.  Denies any fevers, chills or sweats.  Denied chest pain, palpitation, orthopnea or leg edema.  Denied cough, wheezing or hemoptysis.  Denied nausea, vomiting or abdominal pain.  Denies any constipation or diarrhea.  Denies any frequency urgency or hesitancy.  Denies any arthralgias or myalgias.  Denies any skin rashes or lesions.  Denies any bleeding or clotting tendency.  Denies any easy bruising.  Denies any hair or nail changes.  Denies any anxiety or depression.  Remaining review of system is negative.         Medications: I have reviewed the patient's current medications.  Current Outpatient Medications  Medication Sig Dispense Refill  . abiraterone acetate (ZYTIGA) 250 MG tablet TAKE 4 TABLETS BY  MOUTH DAILY. TAKE ON AN EMPTY STOMACH 1 HOUR BEFOREOR 2 HOURS AFTER A MEAL. 120 tablet 0  . calcium-vitamin D (OSCAL WITH D) 500-200 MG-UNIT tablet Take 1 tablet by mouth 2 (two) times daily. 90 tablet 3  . celecoxib (CELEBREX) 50 MG capsule Take 1 capsule (50 mg total) by mouth daily. 4 capsule 0  . fenofibrate 160 MG tablet TAKE 1 TABLET BY MOUTH ONCE A DAY FOR CHOLESTEROL  3  . fluconazole (DIFLUCAN) 100 MG tablet Take 1 tablet (100 mg total) by mouth daily. 5 tablet 0  . Multiple Vitamin (MULTIVITAMIN) tablet Take 1 tablet by mouth daily.    . naproxen sodium (ANAPROX) 220 MG tablet Take 220 mg by mouth 2 (two) times daily with a meal.    . predniSONE (DELTASONE) 5 MG tablet Take 1 tablet (5 mg total) by mouth daily with breakfast. 90 tablet 3  . SUMAtriptan Succinate (ZEMBRACE SYMTOUCH) 3 MG/0.5ML SOAJ Inject 3 mg into the skin as directed. At earliest onset, may repeat in 2 hours if headache persists or reoccurs (Patient not taking: Reported on 10/22/2018) 4.5 mL 3  . valsartan-hydrochlorothiazide (DIOVAN-HCT) 160-12.5 MG tablet Take 1 tablet by mouth daily.  0   No current facility-administered medications for this visit.      Allergies: No Known Allergies  Past Medical History, Surgical history, Social history, and Family History were reviewed and updated.    Physical Exam:   Blood pressure 133/84, pulse 88, temperature 98.9 F (37.2 C), temperature source  Oral, resp. rate 18, height 5\' 10"  (1.778 m), weight 180 lb 3.2 oz (81.7 kg), SpO2 98 %.    ECOG: 0    General appearance: Alert, awake without any distress. Head: Atraumatic without abnormalities Oropharynx: Without any thrush or ulcers. Eyes: No scleral icterus. Lymph nodes: No lymphadenopathy noted in the cervical, supraclavicular, or axillary nodes Heart:regular rate and rhythm, without any murmurs or gallops.   Lung: Clear to auscultation without any rhonchi, wheezes or dullness to percussion. Abdomin: Soft,  nontender without any shifting dullness or ascites. Musculoskeletal: No clubbing or cyanosis. Neurological: No motor or sensory deficits. Skin: No rashes or lesions. Psychiatric: Mood and affect appeared normal.    Results for BODE, PIEPER (MRN [id number]) as of 03/25/2019 12:10  Ref. Range 01/27/2019 12:56 03/17/2019 11:52  Prostate Specific Ag, Serum Latest Ref Range: 0.0 - 4.0 ng/mL <0.1 <0.1      Impression and Plan:    58 year old man with:  1.    Advanced prostate cancer with disease to the bone that is currently castration-sensitive diagnosed in 2019.  He is currently on Zytiga without any major complications.  His PSA continues to be undetectable without any signs or symptoms of disease progression.  Bone scan was personally reviewed obtained on Mar 17, 2019 and imaging studies reviewed with the patient and showed excellent response to therapy.  Risks and benefits of continuing this therapy was discussed today.  Long-term complications associated with this medication were reviewed which include hypertension and edema.  He is agreeable to continue at this time.     2.  Androgen deprivation: He continues to receive Lupron which will be continued indefinitely.  Next injection will be in July 2020.  3.  Bone directed therapy: Complication associated with Delton See were reviewed again.  These include osteonecrosis of the jaw and hypocalcemia.  I recommended continuing treatment for the time being.  4.  Liver function test surveillance: No issues reported based on laboratory testing on May 5.  Complications related to Zytiga include elevation in liver function tests were reiterated.  5.  Hypokalemia: Potassium level continues to be normal at this time.  We will continue to monitor moving forward.  6.  Hypertension: Blood pressure remains close to normal range without any issues or intervention.  7.  Prognosis: His disease remains incurable but aggressive therapy is warranted  given his young age and excellent performance status.  8.  Back pain: Appears to be mechanical in nature and not related to cancer based on bone scan.  9.  Follow-up: We will continue to follow-up in 2 months for repeat evaluation, Lupron and Xgeva injection.  25 minutes was spent with the patient face-to-face today.  More than 50% of time was dedicated to reviewing his disease status, treatment options and complications related to long-term therapy.     Zola Button, MD 5/13/202012:08 PM

## 2019-03-26 ENCOUNTER — Telehealth: Payer: Self-pay | Admitting: Oncology

## 2019-03-26 NOTE — Telephone Encounter (Signed)
Called regarding schedule °

## 2019-04-09 ENCOUNTER — Other Ambulatory Visit: Payer: Self-pay | Admitting: Oncology

## 2019-05-15 ENCOUNTER — Other Ambulatory Visit: Payer: Self-pay | Admitting: Oncology

## 2019-05-15 DIAGNOSIS — C61 Malignant neoplasm of prostate: Secondary | ICD-10-CM

## 2019-05-29 ENCOUNTER — Inpatient Hospital Stay: Payer: BC Managed Care – PPO

## 2019-05-29 ENCOUNTER — Inpatient Hospital Stay: Payer: BC Managed Care – PPO | Attending: Oncology

## 2019-05-29 ENCOUNTER — Other Ambulatory Visit: Payer: Self-pay

## 2019-05-29 ENCOUNTER — Inpatient Hospital Stay (HOSPITAL_BASED_OUTPATIENT_CLINIC_OR_DEPARTMENT_OTHER): Payer: BC Managed Care – PPO | Admitting: Oncology

## 2019-05-29 VITALS — BP 138/88 | HR 75 | Temp 98.9°F | Resp 17 | Ht 70.0 in | Wt 180.6 lb

## 2019-05-29 DIAGNOSIS — C61 Malignant neoplasm of prostate: Secondary | ICD-10-CM

## 2019-05-29 DIAGNOSIS — C7951 Secondary malignant neoplasm of bone: Secondary | ICD-10-CM | POA: Diagnosis present

## 2019-05-29 DIAGNOSIS — E876 Hypokalemia: Secondary | ICD-10-CM | POA: Insufficient documentation

## 2019-05-29 DIAGNOSIS — Z923 Personal history of irradiation: Secondary | ICD-10-CM

## 2019-05-29 DIAGNOSIS — Z79899 Other long term (current) drug therapy: Secondary | ICD-10-CM

## 2019-05-29 DIAGNOSIS — I10 Essential (primary) hypertension: Secondary | ICD-10-CM | POA: Diagnosis not present

## 2019-05-29 LAB — CMP (CANCER CENTER ONLY)
ALT: 11 U/L (ref 0–44)
AST: 16 U/L (ref 15–41)
Albumin: 3.8 g/dL (ref 3.5–5.0)
Alkaline Phosphatase: 45 U/L (ref 38–126)
Anion gap: 12 (ref 5–15)
BUN: 15 mg/dL (ref 6–20)
CO2: 23 mmol/L (ref 22–32)
Calcium: 8.9 mg/dL (ref 8.9–10.3)
Chloride: 103 mmol/L (ref 98–111)
Creatinine: 0.89 mg/dL (ref 0.61–1.24)
GFR, Est AFR Am: >60 mL/min (ref >60)
GFR, Estimated: >60 mL/min (ref >60)
Glucose, Bld: 86 mg/dL (ref 70–99)
Potassium: 3.8 mmol/L (ref 3.5–5.1)
Sodium: 138 mmol/L (ref 135–145)
Total Bilirubin: 0.3 mg/dL (ref 0.3–1.2)
Total Protein: 7.1 g/dL (ref 6.5–8.1)

## 2019-05-29 LAB — CBC WITH DIFFERENTIAL (CANCER CENTER ONLY)
Abs Immature Granulocytes: 0.05 10*3/uL (ref 0.00–0.07)
Basophils Absolute: 0 10*3/uL (ref 0.0–0.1)
Basophils Relative: 0 %
Eosinophils Absolute: 0.1 10*3/uL (ref 0.0–0.5)
Eosinophils Relative: 2 %
HCT: 34.3 % — ABNORMAL LOW (ref 39.0–52.0)
Hemoglobin: 11.8 g/dL — ABNORMAL LOW (ref 13.0–17.0)
Immature Granulocytes: 1 %
Lymphocytes Relative: 13 %
Lymphs Abs: 0.5 10*3/uL — ABNORMAL LOW (ref 0.7–4.0)
MCH: 33.2 pg (ref 26.0–34.0)
MCHC: 34.4 g/dL (ref 30.0–36.0)
MCV: 96.6 fL (ref 80.0–100.0)
Monocytes Absolute: 0.4 10*3/uL (ref 0.1–1.0)
Monocytes Relative: 10 %
Neutro Abs: 2.7 10*3/uL (ref 1.7–7.7)
Neutrophils Relative %: 74 %
Platelet Count: 195 10*3/uL (ref 150–400)
RBC: 3.55 MIL/uL — ABNORMAL LOW (ref 4.22–5.81)
RDW: 12.3 % (ref 11.5–15.5)
WBC Count: 3.6 10*3/uL — ABNORMAL LOW (ref 4.0–10.5)
nRBC: 0 % (ref 0.0–0.2)

## 2019-05-29 MED ORDER — LEUPROLIDE ACETATE (4 MONTH) 30 MG IM KIT
30.0000 mg | PACK | Freq: Once | INTRAMUSCULAR | Status: AC
  Administered 2019-05-29: 30 mg via INTRAMUSCULAR
  Filled 2019-05-29: qty 30

## 2019-05-29 MED ORDER — DENOSUMAB 120 MG/1.7ML ~~LOC~~ SOLN
120.0000 mg | Freq: Once | SUBCUTANEOUS | Status: AC
  Administered 2019-05-29: 14:00:00 120 mg via SUBCUTANEOUS

## 2019-05-29 MED ORDER — DENOSUMAB 120 MG/1.7ML ~~LOC~~ SOLN
SUBCUTANEOUS | Status: AC
  Filled 2019-05-29: qty 1.7

## 2019-05-29 NOTE — Progress Notes (Signed)
Hematology and Oncology Follow Up Visit  Dan Farrell [id number] 02-15-1961 58 y.o. 05/29/2019 1:42 PM Dan Farrell, MDEhinger, Dan [name], MD   Principle Diagnosis: 58 year old man with advanced prostate cancer and disease to the bone diagnosed in 2019.  He has castration-sensitive after presenting with PSA of 11 Gleason score 4+4 = 8 at the time of diagnosis.   Prior Therapy:  Prostate biopsy obtained in May 2019.  Radiation therapy to the prostate completed and November 2019.  He received 45 Gy in 25 fractions followed by 30 Gy in 15 fractions  Current therapy:   He remains on Lupron 30 mg every 4 months started in July 2019.  Zytiga 1000 mg with prednisone 5 mg started in July 2019  Xgeva 120 mg every 8 weeks.  Interim History: Mr. Farrell is here for a repeat evaluation.  Since the last visit, he reports no recent complaints.  He continues to tolerate Lupron but does report issues with hot flashes at times.  He denies any excessive fatigue or tiredness or recent hospitalizations.  He denies any bone pain or pathological fractures.  Continues to work full-time without any decline in his performance status or ability to do self  Patient denied any alteration mental status, neuropathy, confusion or dizziness.  Denies any headaches or lethargy.  Denies any night sweats, weight loss or changes in appetite.  Denied orthopnea, dyspnea on exertion or chest discomfort.  Denies shortness of breath, difficulty breathing hemoptysis or cough.  Denies any abdominal distention, nausea, early satiety or dyspepsia.  Denies any hematuria, frequency, dysuria or nocturia.  Denies any skin irritation, dryness or rash.  Denies any ecchymosis or petechiae.  Denies any lymphadenopathy or clotting.  Denies any heat or cold intolerance.  Denies any anxiety or depression.  Remaining review of system is negative.            Medications: Remain without any changes at this time. Current Outpatient  Medications  Medication Sig Dispense Refill  . abiraterone acetate (ZYTIGA) 250 MG tablet TAKE 4 TABLETS BY MOUTH DAILY. TAKE ON AN EMPTY STOMACH 1 HOUR BEFORE OR 2 HOURS AFTER A MEAL. 120 tablet 0  . calcium-vitamin D (OSCAL WITH D) 500-200 MG-UNIT tablet Take 1 tablet by mouth 2 (two) times daily. 90 tablet 3  . celecoxib (CELEBREX) 50 MG capsule Take 1 capsule (50 mg total) by mouth daily. 4 capsule 0  . fenofibrate 160 MG tablet TAKE 1 TABLET BY MOUTH ONCE A DAY FOR CHOLESTEROL  3  . fluconazole (DIFLUCAN) 100 MG tablet Take 1 tablet (100 mg total) by mouth daily. 5 tablet 0  . Multiple Vitamin (MULTIVITAMIN) tablet Take 1 tablet by mouth daily.    . naproxen sodium (ANAPROX) 220 MG tablet Take 220 mg by mouth 2 (two) times daily with a meal.    . predniSONE (DELTASONE) 5 MG tablet TAKE 1 TABLET BY MOUTH EVERY DAY WITH BREAKFAST 90 tablet 3  . SUMAtriptan Succinate (ZEMBRACE SYMTOUCH) 3 MG/0.5ML SOAJ Inject 3 mg into the skin as directed. At earliest onset, may repeat in 2 hours if headache persists or reoccurs (Patient not taking: Reported on 10/22/2018) 4.5 mL 3  . valsartan-hydrochlorothiazide (DIOVAN-HCT) 160-12.5 MG tablet Take 1 tablet by mouth daily.  0   No current facility-administered medications for this visit.      Allergies: No Known Allergies  Past Medical History, Surgical history, Social history, and Family History updated without any changes.    Physical Exam:   Blood pressure 138/88,  pulse 75, temperature 98.9 F (37.2 C), temperature source Oral, resp. rate 17, height 5\' 10"  (1.778 m), weight 180 lb 9.6 oz (81.9 kg), SpO2 99 %.    ECOG: 0   General appearance: Comfortable appearing without any discomfort Head: Normocephalic without any trauma Oropharynx: Mucous membranes are moist and pink without any thrush or ulcers. Eyes: Pupils are equal and round reactive to light. Lymph nodes: No cervical, supraclavicular, inguinal or axillary lymphadenopathy.    Heart:regular rate and rhythm.  S1 and S2 without leg edema. Lung: Clear without any rhonchi or wheezes.  No dullness to percussion. Abdomin: Soft, nontender, nondistended with good bowel sounds.  No hepatosplenomegaly. Musculoskeletal: No joint deformity or effusion.  Full range of motion noted. Neurological: No deficits noted on motor, sensory and deep tendon reflex exam. Skin: No petechial rash or dryness.  Appeared moist.     Results for MAYRA, BRAHM (MRN [id number]) as of 05/29/2019 13:44  Ref. Range 01/27/2019 12:56 03/17/2019 11:52  Prostate Specific Ag, Serum Latest Ref Range: 0.0 - 4.0 ng/mL <0.1 <0.1       Impression and Plan:    58 year old man with:  1.    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    He has tolerated Zytiga in addition to androgen deprivation without any new complaints.  His PSA continues to be undetectable with disease improvement based on imaging studies.  The natural course of this disease was updated again.  Duration of therapy was also reviewed.  Alternative options to Fabio Asa was discussed today including therapy discontinuation.  After discussion today is agreeable to continue.  Potential complications including weight gain, hypertension among others were updated.     2.  Androgen deprivation: Risks and benefits of continuing androgen deprivation was discussed today.  Long-term complication associated with Lupron bleeding weight gain and osteoporosis.  Alternative options include orchiectomy was reviewed today.  I have recommended that the duration of therapy to be indefinite at this time.  3.  Bone directed therapy: He is currently on Xgeva which I recommended continuing for the time being.  Potential complications including osteonecrosis of the jaw and hypocalcemia.  He is agreeable to continue.  4.  Liver function test surveillance: No issues reported with his liver function test on Zytiga.  5.   Hypokalemia: Potassium level will be monitored on Zytiga and currently within normal range.  6.  Hypertension: No issues reported with his blood pressure on Zytiga.  This will continue to be monitored.  7.  Prognosis: Therapy remains palliative although aggressive measures are warranted given his excellent performance status.  8.  Follow-up: In 2 months for repeat evaluation and Xgeva.  25 minutes was spent with the patient face-to-face today.  More than 50% of time was spent on reviewing his disease status, laboratory data review, therapy options and future plan of care.     Zola Button, MD 7/17/20201:42 PM

## 2019-05-30 LAB — PROSTATE-SPECIFIC AG, SERUM (LABCORP): Prostate Specific Ag, Serum: <0.1 ng/mL (ref 0.0–4.0)

## 2019-06-01 ENCOUNTER — Telehealth: Payer: Self-pay | Admitting: Oncology

## 2019-06-01 ENCOUNTER — Telehealth: Payer: Self-pay

## 2019-06-01 NOTE — Telephone Encounter (Signed)
Contacted patient and made aware of PSA results. 

## 2019-06-01 NOTE — Telephone Encounter (Signed)
-----   Message from Wyatt Portela, MD sent at 06/01/2019  8:20 AM EDT ----- Please let him know his PSA is down.

## 2019-06-01 NOTE — Telephone Encounter (Signed)
Called and spoke with patient. Confirmed date and time of appt  ° °

## 2019-07-05 ENCOUNTER — Other Ambulatory Visit: Payer: Self-pay | Admitting: Oncology

## 2019-07-08 ENCOUNTER — Other Ambulatory Visit: Payer: Self-pay

## 2019-07-08 DIAGNOSIS — C61 Malignant neoplasm of prostate: Secondary | ICD-10-CM

## 2019-07-08 MED ORDER — ABIRATERONE ACETATE 250 MG PO TABS: ORAL_TABLET | ORAL | 0 refills | Status: DC

## 2019-08-04 ENCOUNTER — Ambulatory Visit: Payer: BC Managed Care – PPO | Admitting: Oncology

## 2019-08-04 ENCOUNTER — Other Ambulatory Visit: Payer: BC Managed Care – PPO

## 2019-08-04 ENCOUNTER — Ambulatory Visit: Payer: BC Managed Care – PPO

## 2019-08-06 ENCOUNTER — Inpatient Hospital Stay: Payer: BC Managed Care – PPO

## 2019-08-06 ENCOUNTER — Inpatient Hospital Stay (HOSPITAL_BASED_OUTPATIENT_CLINIC_OR_DEPARTMENT_OTHER): Payer: BC Managed Care – PPO | Admitting: Oncology

## 2019-08-06 ENCOUNTER — Inpatient Hospital Stay: Payer: BC Managed Care – PPO | Attending: Oncology

## 2019-08-06 ENCOUNTER — Other Ambulatory Visit: Payer: Self-pay

## 2019-08-06 VITALS — BP 142/88 | HR 90 | Temp 98.0°F | Resp 18 | Ht 70.0 in | Wt 182.4 lb

## 2019-08-06 DIAGNOSIS — I10 Essential (primary) hypertension: Secondary | ICD-10-CM | POA: Insufficient documentation

## 2019-08-06 DIAGNOSIS — C7951 Secondary malignant neoplasm of bone: Secondary | ICD-10-CM | POA: Insufficient documentation

## 2019-08-06 DIAGNOSIS — C61 Malignant neoplasm of prostate: Secondary | ICD-10-CM

## 2019-08-06 DIAGNOSIS — Z79899 Other long term (current) drug therapy: Secondary | ICD-10-CM | POA: Insufficient documentation

## 2019-08-06 DIAGNOSIS — Z923 Personal history of irradiation: Secondary | ICD-10-CM | POA: Insufficient documentation

## 2019-08-06 LAB — CBC WITH DIFFERENTIAL (CANCER CENTER ONLY)
Abs Immature Granulocytes: 0.03 10*3/uL (ref 0.00–0.07)
Basophils Absolute: 0 10*3/uL (ref 0.0–0.1)
Basophils Relative: 1 %
Eosinophils Absolute: 0.1 10*3/uL (ref 0.0–0.5)
Eosinophils Relative: 2 %
HCT: 34.1 % — ABNORMAL LOW (ref 39.0–52.0)
Hemoglobin: 11.8 g/dL — ABNORMAL LOW (ref 13.0–17.0)
Immature Granulocytes: 1 %
Lymphocytes Relative: 11 %
Lymphs Abs: 0.4 10*3/uL — ABNORMAL LOW (ref 0.7–4.0)
MCH: 33.1 pg (ref 26.0–34.0)
MCHC: 34.6 g/dL (ref 30.0–36.0)
MCV: 95.8 fL (ref 80.0–100.0)
Monocytes Absolute: 0.4 10*3/uL (ref 0.1–1.0)
Monocytes Relative: 10 %
Neutro Abs: 3.1 10*3/uL (ref 1.7–7.7)
Neutrophils Relative %: 75 %
Platelet Count: 202 10*3/uL (ref 150–400)
RBC: 3.56 MIL/uL — ABNORMAL LOW (ref 4.22–5.81)
RDW: 12.5 % (ref 11.5–15.5)
WBC Count: 4 10*3/uL (ref 4.0–10.5)
nRBC: 0 % (ref 0.0–0.2)

## 2019-08-06 LAB — CMP (CANCER CENTER ONLY)
ALT: 10 U/L (ref 0–44)
AST: 15 U/L (ref 15–41)
Albumin: 3.9 g/dL (ref 3.5–5.0)
Alkaline Phosphatase: 47 U/L (ref 38–126)
Anion gap: 7 (ref 5–15)
BUN: 13 mg/dL (ref 6–20)
CO2: 28 mmol/L (ref 22–32)
Calcium: 9.8 mg/dL (ref 8.9–10.3)
Chloride: 103 mmol/L (ref 98–111)
Creatinine: 0.94 mg/dL (ref 0.61–1.24)
GFR, Est AFR Am: >60 mL/min (ref >60)
GFR, Estimated: >60 mL/min (ref >60)
Glucose, Bld: 106 mg/dL — ABNORMAL HIGH (ref 70–99)
Potassium: 4.4 mmol/L (ref 3.5–5.1)
Sodium: 138 mmol/L (ref 135–145)
Total Bilirubin: 0.3 mg/dL (ref 0.3–1.2)
Total Protein: 7.1 g/dL (ref 6.5–8.1)

## 2019-08-06 MED ORDER — DENOSUMAB 120 MG/1.7ML ~~LOC~~ SOLN
120.0000 mg | Freq: Once | SUBCUTANEOUS | Status: AC
  Administered 2019-08-06: 16:00:00 120 mg via SUBCUTANEOUS

## 2019-08-06 MED ORDER — DENOSUMAB 120 MG/1.7ML ~~LOC~~ SOLN
SUBCUTANEOUS | Status: AC
  Filled 2019-08-06: qty 1.7

## 2019-08-06 NOTE — Patient Instructions (Signed)
Denosumab injection What is this medicine? DENOSUMAB (den oh sue mab) slows bone breakdown. Prolia is used to treat osteoporosis in women after menopause and in men, and in people who are taking corticosteroids for 6 months or more. Xgeva is used to treat a high calcium level due to cancer and to prevent bone fractures and other bone problems caused by multiple myeloma or cancer bone metastases. Xgeva is also used to treat giant cell tumor of the bone. This medicine may be used for other purposes; ask your health care provider or pharmacist if you have questions. COMMON BRAND NAME(S): Prolia, XGEVA What should I tell my health care provider before I take this medicine? They need to know if you have any of these conditions:  dental disease  having surgery or tooth extraction  infection  kidney disease  low levels of calcium or Vitamin D in the blood  malnutrition  on hemodialysis  skin conditions or sensitivity  thyroid or parathyroid disease  an unusual reaction to denosumab, other medicines, foods, dyes, or preservatives  pregnant or trying to get pregnant  breast-feeding How should I use this medicine? This medicine is for injection under the skin. It is given by a health care professional in a hospital or clinic setting. A special MedGuide will be given to you before each treatment. Be sure to read this information carefully each time. For Prolia, talk to your pediatrician regarding the use of this medicine in children. Special care may be needed. For Xgeva, talk to your pediatrician regarding the use of this medicine in children. While this drug may be prescribed for children as young as 13 years for selected conditions, precautions do apply. Overdosage: If you think you have taken too much of this medicine contact a poison control center or emergency room at once. NOTE: This medicine is only for you. Do not share this medicine with others. What if I miss a dose? It is  important not to miss your dose. Call your doctor or health care professional if you are unable to keep an appointment. What may interact with this medicine? Do not take this medicine with any of the following medications:  other medicines containing denosumab This medicine may also interact with the following medications:  medicines that lower your chance of fighting infection  steroid medicines like prednisone or cortisone This list may not describe all possible interactions. Give your health care provider a list of all the medicines, herbs, non-prescription drugs, or dietary supplements you use. Also tell them if you smoke, drink alcohol, or use illegal drugs. Some items may interact with your medicine. What should I watch for while using this medicine? Visit your doctor or health care professional for regular checks on your progress. Your doctor or health care professional may order blood tests and other tests to see how you are doing. Call your doctor or health care professional for advice if you get a fever, chills or sore throat, or other symptoms of a cold or flu. Do not treat yourself. This drug may decrease your body's ability to fight infection. Try to avoid being around people who are sick. You should make sure you get enough calcium and vitamin D while you are taking this medicine, unless your doctor tells you not to. Discuss the foods you eat and the vitamins you take with your health care professional. See your dentist regularly. Brush and floss your teeth as directed. Before you have any dental work done, tell your dentist you are   receiving this medicine. Do not become pregnant while taking this medicine or for 5 months after stopping it. Talk with your doctor or health care professional about your birth control options while taking this medicine. Women should inform their doctor if they wish to become pregnant or think they might be pregnant. There is a potential for serious side  effects to an unborn child. Talk to your health care professional or pharmacist for more information. What side effects may I notice from receiving this medicine? Side effects that you should report to your doctor or health care professional as soon as possible:  allergic reactions like skin rash, itching or hives, swelling of the face, lips, or tongue  bone pain  breathing problems  dizziness  jaw pain, especially after dental work  redness, blistering, peeling of the skin  signs and symptoms of infection like fever or chills; cough; sore throat; pain or trouble passing urine  signs of low calcium like fast heartbeat, muscle cramps or muscle pain; pain, tingling, numbness in the hands or feet; seizures  unusual bleeding or bruising  unusually weak or tired Side effects that usually do not require medical attention (report to your doctor or health care professional if they continue or are bothersome):  constipation  diarrhea  headache  joint pain  loss of appetite  muscle pain  runny nose  tiredness  upset stomach This list may not describe all possible side effects. Call your doctor for medical advice about side effects. You may report side effects to FDA at 1-800-FDA-1088. Where should I keep my medicine? This medicine is only given in a clinic, doctor's office, or other health care setting and will not be stored at home. NOTE: This sheet is a summary. It may not cover all possible information. If you have questions about this medicine, talk to your doctor, pharmacist, or health care provider.  2020 Elsevier/Gold Standard (2018-03-07 16:10:44)

## 2019-08-06 NOTE — Progress Notes (Signed)
Hematology and Oncology Follow Up Visit  Dan Farrell [id number] [birth year] 58 y.o. 08/06/2019 2:50 PM [name], Dan Farrell, MDEhinger, Dan [name], MD   Principle Diagnosis: 58 year old man with castration-sensitive advanced prostate cancer with disease to the bone presented with PSA of 11 Gleason score 4+4 = 8 in 2019.   Prior Therapy:  Prostate biopsy obtained in May 2019.  Radiation therapy to the prostate completed and November 2019.  He received 45 Gy in 25 fractions followed by 30 Gy in 15 fractions  Current therapy:   He remains on Lupron 30 mg every 4 months started in July 2019.  Zytiga 1000 mg with prednisone 5 mg started in July 2019  Xgeva 120 mg every 8 weeks.  Interim History: [name] is here for a follow-up.  Since last visit, he reports no major changes in his health.  He is evaluated for a possible dental filling and dental procedure although not scheduled at this time.  He continues to tolerate Zytiga without any complaints at this time.  He denies any nausea or fatigue.  He denies any lower extremity edema.  He denies any recent hospitalizations or illnesses.  His performance status and quality of life remains excellent.  He denied headaches, blurry vision, syncope or seizures.  Denies any fevers, chills or sweats.  Denied chest pain, palpitation, orthopnea or leg edema.  Denied cough, wheezing or hemoptysis.  Denied nausea, vomiting or abdominal pain.  Denies any constipation or diarrhea.  Denies any frequency urgency or hesitancy.  Denies any arthralgias or myalgias.  Denies any skin rashes or lesions.  Denies any bleeding or clotting tendency.  Denies any easy bruising.  Denies any hair or nail changes.  Denies any anxiety or depression.  Remaining review of system is negative.              Medications: Updated on review. Current Outpatient Medications  Medication Sig Dispense Refill  . abiraterone acetate (ZYTIGA) 250 MG tablet TAKE 4 TABLETS BY MOUTH  DAILY. TAKE ON AN EMPTY STOMACH 1 HOUR BEFORE OR 2 HOURS AFTER A MEAL. 120 tablet 0  . celecoxib (CELEBREX) 50 MG capsule Take 1 capsule (50 mg total) by mouth daily. 4 capsule 0  . fenofibrate 160 MG tablet TAKE 1 TABLET BY MOUTH ONCE A DAY FOR CHOLESTEROL  3  . fluconazole (DIFLUCAN) 100 MG tablet Take 1 tablet (100 mg total) by mouth daily. 5 tablet 0  . Multiple Vitamin (MULTIVITAMIN) tablet Take 1 tablet by mouth daily.    . naproxen sodium (ANAPROX) 220 MG tablet Take 220 mg by mouth 2 (two) times daily with a meal.    . OS-CAL CALCIUM + D3 500-200 MG-UNIT TABS TAKE 1 TABLET BY MOUTH TWICE A DAY 90 tablet 3  . predniSONE (DELTASONE) 5 MG tablet TAKE 1 TABLET BY MOUTH EVERY DAY WITH BREAKFAST 90 tablet 3  . SUMAtriptan Succinate (ZEMBRACE SYMTOUCH) 3 MG/0.5ML SOAJ Inject 3 mg into the skin as directed. At earliest onset, may repeat in 2 hours if headache persists or reoccurs (Patient not taking: Reported on 10/22/2018) 4.5 mL 3  . valsartan-hydrochlorothiazide (DIOVAN-HCT) 160-12.5 MG tablet Take 1 tablet by mouth daily.  0   No current facility-administered medications for this visit.      Allergies: No Known Allergies  Past Medical History, Surgical history, Social history, and Family History without any changes on review.    Physical Exam:   Blood pressure (!) 142/88, pulse 90, temperature 98 F (36.7 C), temperature source Oral,  resp. rate 18, height 5\' 10"  (1.778 m), weight 182 lb 6.4 oz (82.7 kg), SpO2 99 %.     ECOG: 0    General appearance: Alert, awake without any distress. Head: Atraumatic without abnormalities Oropharynx: Without any thrush or ulcers. Eyes: No scleral icterus. Lymph nodes: No lymphadenopathy noted in the cervical, supraclavicular, or axillary nodes Heart:regular rate and rhythm, without any murmurs or gallops.   Lung: Clear to auscultation without any rhonchi, wheezes or dullness to percussion. Abdomin: Soft, nontender without any shifting  dullness or ascites. Musculoskeletal: No clubbing or cyanosis. Neurological: No motor or sensory deficits. Skin: No rashes or lesions. Psychiatric: Mood and affect appeared normal.     Results for OLLICE, KITTELL (MRN [id number]) as of 08/06/2019 14:49  Ref. Range 03/17/2019 11:52 05/29/2019 13:03  Prostate Specific Ag, Serum Latest Ref Range: 0.0 - 4.0 ng/mL <0.1 <0.1      Impression and Plan:    58 year old man with:  1.    Advanced prostate cancer with disease to the bone diagnosed in May 2019.  He continues to have castration-sensitive disease.  His PSA continues to be undetectable and currently tolerating Zytiga without any complaints.  The natural course of this disease and duration of Zytiga was reiterated.  Long-term complications including hypertension, renal insufficiency and edema were reviewed.    2.  Androgen deprivation: He continues to tolerate therapy without any major complications.  Long-term complications including osteoporosis, weight gain, hot flashes and sexual dysfunction were reiterated.  His next Lupron will be given in November 2020.  3.  Bone directed therapy: He has tolerated Xgeva without any complaints.  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  He will receive 1 today and every 2 months.  In anticipation of dental procedure I offered to hold Xgeva for the time being although he is not sure if he wants to have any of his dental procedure done at this time.  It is important to hold treatment at least 2 to 4 months prior to mitigate any potential complications.  The plan is to continue with Xgeva for the time being and will let me know in advance to withhold Xgeva.  4.  Liver function test surveillance: Liver function tests continue to be within normal range and needs to be monitored on Zytiga.  5.  Hypokalemia: His hypokalemia has been mild and for the most part does not require any replacement on Zytiga.  6.  Hypertension:  Blood pressure remains manageable at this time without any changes.  We will continue to monitor on Zytiga.  7.  Prognosis: His disease is incurable although aggressive measures are warranted given his excellent performance status.  8.  Follow-up: He will return in 2 months for evaluation as well as Xgeva and Lupron.  25 minutes was spent with the patient face-to-face today.  More than 50% of time was dedicated to updating his disease status, treatment options and answering questions and future plan of care.     Zola Button, MD 9/24/20202:50 PM

## 2019-08-07 ENCOUNTER — Telehealth: Payer: Self-pay

## 2019-08-07 LAB — PROSTATE-SPECIFIC AG, SERUM (LABCORP): Prostate Specific Ag, Serum: <0.1 ng/mL (ref 0.0–4.0)

## 2019-08-07 NOTE — Telephone Encounter (Signed)
-----   Message from Wyatt Portela, MD sent at 08/07/2019  8:14 AM EDT ----- Please let him know his PSA is still low

## 2019-08-07 NOTE — Telephone Encounter (Signed)
Left message that PSA is low but to call back for specific lab values.

## 2019-08-12 ENCOUNTER — Telehealth: Payer: Self-pay | Admitting: Oncology

## 2019-08-12 NOTE — Telephone Encounter (Signed)
Called and spoke with patient. Confirmed appts  °

## 2019-08-14 ENCOUNTER — Other Ambulatory Visit: Payer: Self-pay | Admitting: Oncology

## 2019-08-14 DIAGNOSIS — C61 Malignant neoplasm of prostate: Secondary | ICD-10-CM

## 2019-10-06 ENCOUNTER — Ambulatory Visit: Payer: BC Managed Care – PPO

## 2019-10-06 ENCOUNTER — Ambulatory Visit: Payer: BC Managed Care – PPO | Admitting: Oncology

## 2019-10-06 ENCOUNTER — Other Ambulatory Visit: Payer: BC Managed Care – PPO

## 2019-10-13 ENCOUNTER — Other Ambulatory Visit: Payer: Self-pay | Admitting: Oncology

## 2019-10-13 DIAGNOSIS — C61 Malignant neoplasm of prostate: Secondary | ICD-10-CM

## 2019-10-14 ENCOUNTER — Other Ambulatory Visit: Payer: Self-pay

## 2019-10-14 DIAGNOSIS — C61 Malignant neoplasm of prostate: Secondary | ICD-10-CM

## 2019-10-15 ENCOUNTER — Inpatient Hospital Stay: Payer: BC Managed Care – PPO

## 2019-10-15 ENCOUNTER — Other Ambulatory Visit: Payer: Self-pay

## 2019-10-15 ENCOUNTER — Inpatient Hospital Stay (HOSPITAL_BASED_OUTPATIENT_CLINIC_OR_DEPARTMENT_OTHER): Payer: BC Managed Care – PPO | Admitting: Oncology

## 2019-10-15 ENCOUNTER — Inpatient Hospital Stay: Payer: BC Managed Care – PPO | Attending: Oncology

## 2019-10-15 VITALS — BP 152/94 | HR 92 | Temp 97.8°F | Resp 18 | Wt 181.4 lb

## 2019-10-15 DIAGNOSIS — C61 Malignant neoplasm of prostate: Secondary | ICD-10-CM

## 2019-10-15 DIAGNOSIS — I10 Essential (primary) hypertension: Secondary | ICD-10-CM | POA: Insufficient documentation

## 2019-10-15 DIAGNOSIS — Z923 Personal history of irradiation: Secondary | ICD-10-CM | POA: Insufficient documentation

## 2019-10-15 DIAGNOSIS — Z79899 Other long term (current) drug therapy: Secondary | ICD-10-CM | POA: Diagnosis not present

## 2019-10-15 DIAGNOSIS — C7951 Secondary malignant neoplasm of bone: Secondary | ICD-10-CM | POA: Diagnosis not present

## 2019-10-15 DIAGNOSIS — E876 Hypokalemia: Secondary | ICD-10-CM | POA: Insufficient documentation

## 2019-10-15 LAB — CBC WITH DIFFERENTIAL (CANCER CENTER ONLY)
Abs Immature Granulocytes: 0.01 10*3/uL (ref 0.00–0.07)
Basophils Absolute: 0 10*3/uL (ref 0.0–0.1)
Basophils Relative: 1 %
Eosinophils Absolute: 0.2 10*3/uL (ref 0.0–0.5)
Eosinophils Relative: 6 %
HCT: 36 % — ABNORMAL LOW (ref 39.0–52.0)
Hemoglobin: 12.4 g/dL — ABNORMAL LOW (ref 13.0–17.0)
Immature Granulocytes: 0 %
Lymphocytes Relative: 14 %
Lymphs Abs: 0.5 10*3/uL — ABNORMAL LOW (ref 0.7–4.0)
MCH: 32.6 pg (ref 26.0–34.0)
MCHC: 34.4 g/dL (ref 30.0–36.0)
MCV: 94.7 fL (ref 80.0–100.0)
Monocytes Absolute: 0.4 10*3/uL (ref 0.1–1.0)
Monocytes Relative: 11 %
Neutro Abs: 2.5 10*3/uL (ref 1.7–7.7)
Neutrophils Relative %: 68 %
Platelet Count: 208 10*3/uL (ref 150–400)
RBC: 3.8 MIL/uL — ABNORMAL LOW (ref 4.22–5.81)
RDW: 12.6 % (ref 11.5–15.5)
WBC Count: 3.7 10*3/uL — ABNORMAL LOW (ref 4.0–10.5)
nRBC: 0 % (ref 0.0–0.2)

## 2019-10-15 LAB — CMP (CANCER CENTER ONLY)
ALT: 11 U/L (ref 0–44)
AST: 16 U/L (ref 15–41)
Albumin: 4 g/dL (ref 3.5–5.0)
Alkaline Phosphatase: 45 U/L (ref 38–126)
Anion gap: 11 (ref 5–15)
BUN: 12 mg/dL (ref 6–20)
CO2: 28 mmol/L (ref 22–32)
Calcium: 9.4 mg/dL (ref 8.9–10.3)
Chloride: 102 mmol/L (ref 98–111)
Creatinine: 0.99 mg/dL (ref 0.61–1.24)
GFR, Est AFR Am: >60 mL/min (ref >60)
GFR, Estimated: >60 mL/min (ref >60)
Glucose, Bld: 87 mg/dL (ref 70–99)
Potassium: 4.2 mmol/L (ref 3.5–5.1)
Sodium: 141 mmol/L (ref 135–145)
Total Bilirubin: 0.4 mg/dL (ref 0.3–1.2)
Total Protein: 7.3 g/dL (ref 6.5–8.1)

## 2019-10-15 MED ORDER — DENOSUMAB 120 MG/1.7ML ~~LOC~~ SOLN
SUBCUTANEOUS | Status: AC
  Filled 2019-10-15: qty 1.7

## 2019-10-15 MED ORDER — LEUPROLIDE ACETATE (4 MONTH) 30 MG ~~LOC~~ KIT
30.0000 mg | PACK | Freq: Once | SUBCUTANEOUS | Status: AC
  Administered 2019-10-15: 30 mg via SUBCUTANEOUS
  Filled 2019-10-15: qty 30

## 2019-10-15 MED ORDER — DENOSUMAB 120 MG/1.7ML ~~LOC~~ SOLN
120.0000 mg | Freq: Once | SUBCUTANEOUS | Status: AC
  Administered 2019-10-15: 120 mg via SUBCUTANEOUS

## 2019-10-15 NOTE — Progress Notes (Signed)
Hematology and Oncology Follow Up Visit  Dan Farrell [id number] January 17, 1961 58 y.o. 10/15/2019 2:32 PM Dan Farrell, Dan Farrell, MDEhinger, Dan [name], MD   Principle Diagnosis: 58 year old man with with advanced prostate cancer to the bone diagnosed in 2019.  He has castration-sensitive with PSA of 11 Gleason score 4+4 = 8.    Prior Therapy:  Prostate biopsy obtained in May 2019.  Radiation therapy to the prostate completed and November 2019.  He received 45 Gy in 25 fractions followed by 30 Gy in 15 fractions  Current therapy:  Lupron 30 mg every 4 months started in July 2019.  Last injection given in July 2020.  Zytiga 1000 mg with prednisone 5 mg started in July 2019  Xgeva 120 mg every 8 weeks.  Interim History: Mr. Dan Farrell is here for return evaluation.  Since the last visit, he reports no major changes in his health.  He continues to tolerate Zytiga without any recent complaints.  He denies any excessive fatigue, nausea or edema.  Continues to work full-time without any decline in ability to do so.  Denies any bone pain or pathological fractures.  Patient denied any alteration mental status, neuropathy, confusion or dizziness.  Denies any headaches or lethargy.  Denies any night sweats, weight loss or changes in appetite.  Denied orthopnea, dyspnea on exertion or chest discomfort.  Denies shortness of breath, difficulty breathing hemoptysis or cough.  Denies any abdominal distention, nausea, early satiety or dyspepsia.  Denies any hematuria, frequency, dysuria or nocturia.  Denies any skin irritation, dryness or rash.  Denies any ecchymosis or petechiae.  Denies any lymphadenopathy or clotting.  Denies any heat or cold intolerance.  Denies any anxiety or depression.  Remaining review of system is negative.          Medications: Reviewed without any changes. Current Outpatient Medications  Medication Sig Dispense Refill  . abiraterone acetate (ZYTIGA) 250 MG tablet TAKE 4 TABLETS  BY MOUTH 1 TIME A DAY. TAKE ON AN EMPTY STOMACH 1 HOUR BEFORE OR 2 HOURS AFTER A MEAL. 120 tablet 0  . celecoxib (CELEBREX) 50 MG capsule Take 1 capsule (50 mg total) by mouth daily. 4 capsule 0  . fenofibrate 160 MG tablet TAKE 1 TABLET BY MOUTH ONCE A DAY FOR CHOLESTEROL  3  . fluconazole (DIFLUCAN) 100 MG tablet Take 1 tablet (100 mg total) by mouth daily. 5 tablet 0  . Multiple Vitamin (MULTIVITAMIN) tablet Take 1 tablet by mouth daily.    . naproxen sodium (ANAPROX) 220 MG tablet Take 220 mg by mouth 2 (two) times daily with a meal.    . OS-CAL CALCIUM + D3 500-200 MG-UNIT TABS TAKE 1 TABLET BY MOUTH TWICE A DAY 90 tablet 3  . predniSONE (DELTASONE) 5 MG tablet TAKE 1 TABLET BY MOUTH EVERY DAY WITH BREAKFAST 90 tablet 3  . SUMAtriptan Succinate (ZEMBRACE SYMTOUCH) 3 MG/0.5ML SOAJ Inject 3 mg into the skin as directed. At earliest onset, may repeat in 2 hours if headache persists or reoccurs (Patient not taking: Reported on 10/22/2018) 4.5 mL 3  . valsartan-hydrochlorothiazide (DIOVAN-HCT) 160-12.5 MG tablet Take 1 tablet by mouth daily.  0   No current facility-administered medications for this visit.      Allergies: No Known Allergies  Past Medical History, Surgical history, Social history, and Family History updated without any changes on review.   Physical Exam:    Blood pressure (!) 152/94, pulse 92, temperature 97.8 F (36.6 C), temperature source Temporal, resp. rate 18, weight 181  lb 6.4 oz (82.3 kg), SpO2 99 %.     ECOG: 0    General appearance: Comfortable appearing without any discomfort Head: Normocephalic without any trauma Oropharynx: Mucous membranes are moist and pink without any thrush or ulcers. Eyes: Pupils are equal and round reactive to light. Lymph nodes: No cervical, supraclavicular, inguinal or axillary lymphadenopathy.   Heart:regular rate and rhythm.  S1 and S2 without leg edema. Lung: Clear without any rhonchi or wheezes.  No dullness to  percussion. Abdomin: Soft, nontender, nondistended with good bowel sounds.  No hepatosplenomegaly. Musculoskeletal: No joint deformity or effusion.  Full range of motion noted. Neurological: No deficits noted on motor, sensory and deep tendon reflex exam. Skin: No petechial rash or dryness.  Appeared moist.  Psychiatric: Mood and affect appeared appropriate.  CBC    Component Value Date/Time   WBC 4.0 08/06/2019 1442   RBC 3.56 (L) 08/06/2019 1442   HGB 11.8 (L) 08/06/2019 1442   HCT 34.1 (L) 08/06/2019 1442   PLT 202 08/06/2019 1442   MCV 95.8 08/06/2019 1442   MCH 33.1 08/06/2019 1442   MCHC 34.6 08/06/2019 1442   RDW 12.5 08/06/2019 1442   LYMPHSABS 0.4 (L) 08/06/2019 1442   MONOABS 0.4 08/06/2019 1442   EOSABS 0.1 08/06/2019 1442   BASOSABS 0.0 08/06/2019 1442        Results for [name], [name] (MRN [id number]) as of 10/15/2019 14:35  Ref. Range 08/06/2019 14:42  Prostate Specific Ag, Serum Latest Ref Range: 0.0 - 4.0 ng/mL <0.1      Impression and Plan:    58 year old man with:  1.    Castration-sensitive advanced prostate cancer with bone disease as noted in 2019.     He is currently on Zytiga in addition to androgen deprivation with excellent PSA response.  The natural course of this disease and treatment options were reiterated.  Discontinuation of Zytiga versus continuing for the time being was reviewed.  Alternative options such as systemic chemotherapy among others will be deferred if needed in the future.  For the time being is agreeable to continue.   2.  Androgen deprivation: He is scheduled to receive Eligard today and repeated in 4 months.  Long-term complications such as weight gain, hot flashes and osteoporosis were reiterated.  3.  Bone directed therapy: Last Delton See was given in September 2020.  Risks and benefits of resuming this therapy in the setting of a possible dental procedure was discussed.  Potential complications such as osteonecrosis of  the jaw and hypocalcemia were also reviewed.  He will defer his dental surgery for the time being continue on Xgeva.  4.  Liver function test surveillance: No issues noted on his laboratory testing previously.  5.  Hypokalemia: Related to Zytiga continues and potassium corrected for the most part.  6.  Hypertension: His blood pressure is mildly elevated today but has been reasonably controlled overall.  We will continue to monitor.  7.  Prognosis: Therapy remains palliative despite his excellent performance status and benefit at this time.  Aggressive measures are warranted.  8.  Follow-up: In 2 months for repeat follow-up.   25 minutes was spent with the patient face-to-face today.  More than 50% of time was spent on reviewing laboratory data, treatment options, disease status update as well as managing complications related to current therapy.     Zola Button, MD 12/3/20202:32 PM

## 2019-10-15 NOTE — Patient Instructions (Signed)
Denosumab injection What is this medicine? DENOSUMAB (den oh sue mab) slows bone breakdown. Prolia is used to treat osteoporosis in women after menopause and in men, and in people who are taking corticosteroids for 6 months or more. Xgeva is used to treat a high calcium level due to cancer and to prevent bone fractures and other bone problems caused by multiple myeloma or cancer bone metastases. Xgeva is also used to treat giant cell tumor of the bone. This medicine may be used for other purposes; ask your health care provider or pharmacist if you have questions. COMMON BRAND NAME(S): Prolia, XGEVA What should I tell my health care provider before I take this medicine? They need to know if you have any of these conditions:  dental disease  having surgery or tooth extraction  infection  kidney disease  low levels of calcium or Vitamin D in the blood  malnutrition  on hemodialysis  skin conditions or sensitivity  thyroid or parathyroid disease  an unusual reaction to denosumab, other medicines, foods, dyes, or preservatives  pregnant or trying to get pregnant  breast-feeding How should I use this medicine? This medicine is for injection under the skin. It is given by a health care professional in a hospital or clinic setting. A special MedGuide will be given to you before each treatment. Be sure to read this information carefully each time. For Prolia, talk to your pediatrician regarding the use of this medicine in children. Special care may be needed. For Xgeva, talk to your pediatrician regarding the use of this medicine in children. While this drug may be prescribed for children as young as 13 years for selected conditions, precautions do apply. Overdosage: If you think you have taken too much of this medicine contact a poison control center or emergency room at once. NOTE: This medicine is only for you. Do not share this medicine with others. What if I miss a dose? It is  important not to miss your dose. Call your doctor or health care professional if you are unable to keep an appointment. What may interact with this medicine? Do not take this medicine with any of the following medications:  other medicines containing denosumab This medicine may also interact with the following medications:  medicines that lower your chance of fighting infection  steroid medicines like prednisone or cortisone This list may not describe all possible interactions. Give your health care provider a list of all the medicines, herbs, non-prescription drugs, or dietary supplements you use. Also tell them if you smoke, drink alcohol, or use illegal drugs. Some items may interact with your medicine. What should I watch for while using this medicine? Visit your doctor or health care professional for regular checks on your progress. Your doctor or health care professional may order blood tests and other tests to see how you are doing. Call your doctor or health care professional for advice if you get a fever, chills or sore throat, or other symptoms of a cold or flu. Do not treat yourself. This drug may decrease your body's ability to fight infection. Try to avoid being around people who are sick. You should make sure you get enough calcium and vitamin D while you are taking this medicine, unless your doctor tells you not to. Discuss the foods you eat and the vitamins you take with your health care professional. See your dentist regularly. Brush and floss your teeth as directed. Before you have any dental work done, tell your dentist you are   receiving this medicine. Do not become pregnant while taking this medicine or for 5 months after stopping it. Talk with your doctor or health care professional about your birth control options while taking this medicine. Women should inform their doctor if they wish to become pregnant or think they might be pregnant. There is a potential for serious side  effects to an unborn child. Talk to your health care professional or pharmacist for more information. What side effects may I notice from receiving this medicine? Side effects that you should report to your doctor or health care professional as soon as possible:  allergic reactions like skin rash, itching or hives, swelling of the face, lips, or tongue  bone pain  breathing problems  dizziness  jaw pain, especially after dental work  redness, blistering, peeling of the skin  signs and symptoms of infection like fever or chills; cough; sore throat; pain or trouble passing urine  signs of low calcium like fast heartbeat, muscle cramps or muscle pain; pain, tingling, numbness in the hands or feet; seizures  unusual bleeding or bruising  unusually weak or tired Side effects that usually do not require medical attention (report to your doctor or health care professional if they continue or are bothersome):  constipation  diarrhea  headache  joint pain  loss of appetite  muscle pain  runny nose  tiredness  upset stomach This list may not describe all possible side effects. Call your doctor for medical advice about side effects. You may report side effects to FDA at 1-800-FDA-1088. Where should I keep my medicine? This medicine is only given in a clinic, doctor's office, or other health care setting and will not be stored at home. NOTE: This sheet is a summary. It may not cover all possible information. If you have questions about this medicine, talk to your doctor, pharmacist, or health care provider.  2020 Elsevier/Gold Standard (2018-03-07 16:10:44) Leuprolide injection What is this medicine? LEUPROLIDE (loo PROE lide) is a man-made hormone. It is used to treat the symptoms of prostate cancer. This medicine may also be used to treat children with early onset of puberty. It may be used for other hormonal conditions. This medicine may be used for other purposes; ask your  health care provider or pharmacist if you have questions. COMMON BRAND NAME(S): Lupron What should I tell my health care provider before I take this medicine? They need to know if you have any of these conditions:  diabetes  heart disease or previous heart attack  high blood pressure  high cholesterol  pain or difficulty passing urine  spinal cord metastasis  stroke  tobacco smoker  an unusual or allergic reaction to leuprolide, benzyl alcohol, other medicines, foods, dyes, or preservatives  pregnant or trying to get pregnant  breast-feeding How should I use this medicine? This medicine is for injection under the skin or into a muscle. You will be taught how to prepare and give this medicine. Use exactly as directed. Take your medicine at regular intervals. Do not take your medicine more often than directed. It is important that you put your used needles and syringes in a special sharps container. Do not put them in a trash can. If you do not have a sharps container, call your pharmacist or healthcare provider to get one. A special MedGuide will be given to you by the pharmacist with each prescription and refill. Be sure to read this information carefully each time. Talk to your pediatrician regarding the use of  this medicine in children. While this medicine may be prescribed for children as young as 8 years for selected conditions, precautions do apply. Overdosage: If you think you have taken too much of this medicine contact a poison control center or emergency room at once. NOTE: This medicine is only for you. Do not share this medicine with others. What if I miss a dose? If you miss a dose, take it as soon as you can. If it is almost time for your next dose, take only that dose. Do not take double or extra doses. What may interact with this medicine? Do not take this medicine with any of the following medications:  chasteberry This medicine may also interact with the  following medications:  herbal or dietary supplements, like black cohosh or DHEA  male hormones, like estrogens or progestins and birth control pills, patches, rings, or injections  male hormones, like testosterone This list may not describe all possible interactions. Give your health care provider a list of all the medicines, herbs, non-prescription drugs, or dietary supplements you use. Also tell them if you smoke, drink alcohol, or use illegal drugs. Some items may interact with your medicine. What should I watch for while using this medicine? Visit your doctor or health care professional for regular checks on your progress. During the first week, your symptoms may get worse, but then will improve as you continue your treatment. You may get hot flashes, increased bone pain, increased difficulty passing urine, or an aggravation of nerve symptoms. Discuss these effects with your doctor or health care professional, some of them may improve with continued use of this medicine. Male patients may experience a menstrual cycle or spotting during the first 2 months of therapy with this medicine. If this continues, contact your doctor or health care professional. This medicine may increase blood sugar. Ask your healthcare provider if changes in diet or medicines are needed if you have diabetes. What side effects may I notice from receiving this medicine? Side effects that you should report to your doctor or health care professional as soon as possible:  allergic reactions like skin rash, itching or hives, swelling of the face, lips, or tongue  breathing problems  chest pain  depression or memory disorders  pain in your legs or groin  pain at site where injected  severe headache  signs and symptoms of high blood sugar such as being more thirsty or hungry or having to urinate more than normal. You may also feel very tired or have blurry vision  swelling of the feet and legs  visual  changes  vomiting Side effects that usually do not require medical attention (report to your doctor or health care professional if they continue or are bothersome):  breast swelling or tenderness  decrease in sex drive or performance  diarrhea  hot flashes  loss of appetite  muscle, joint, or bone pains  nausea  redness or irritation at site where injected  skin problems or acne This list may not describe all possible side effects. Call your doctor for medical advice about side effects. You may report side effects to FDA at 1-800-FDA-1088. Where should I keep my medicine? Keep out of the reach of children. Store below 25 degrees C (77 degrees F). Do not freeze. Protect from light. Do not use if it is not clear or if there are particles present. Throw away any unused medicine after the expiration date. NOTE: This sheet is a summary. It may not cover all   possible information. If you have questions about this medicine, talk to your doctor, pharmacist, or health care provider.  2020 Elsevier/Gold Standard (2018-08-28 09:52:48)

## 2019-10-16 ENCOUNTER — Telehealth: Payer: Self-pay | Admitting: *Deleted

## 2019-10-16 LAB — PROSTATE-SPECIFIC AG, SERUM (LABCORP): Prostate Specific Ag, Serum: <0.1 ng/mL (ref 0.0–4.0)

## 2019-10-16 NOTE — Telephone Encounter (Signed)
-----   Message from Wyatt Portela, MD sent at 10/16/2019  8:51 AM EST ----- Please let him know his PSA is low.

## 2019-10-16 NOTE — Telephone Encounter (Signed)
LM with note below 

## 2019-12-14 ENCOUNTER — Other Ambulatory Visit: Payer: Self-pay | Admitting: Oncology

## 2019-12-14 DIAGNOSIS — C61 Malignant neoplasm of prostate: Secondary | ICD-10-CM

## 2019-12-15 ENCOUNTER — Other Ambulatory Visit: Payer: Self-pay

## 2019-12-15 DIAGNOSIS — C61 Malignant neoplasm of prostate: Secondary | ICD-10-CM

## 2019-12-16 ENCOUNTER — Inpatient Hospital Stay: Payer: BC Managed Care – PPO

## 2019-12-16 ENCOUNTER — Inpatient Hospital Stay: Payer: BC Managed Care – PPO | Attending: Oncology | Admitting: Oncology

## 2019-12-16 ENCOUNTER — Telehealth: Payer: Self-pay | Admitting: Oncology

## 2019-12-16 ENCOUNTER — Other Ambulatory Visit: Payer: Self-pay

## 2019-12-16 VITALS — BP 144/86 | HR 86 | Temp 97.5°F | Resp 18 | Ht 70.0 in | Wt 177.9 lb

## 2019-12-16 DIAGNOSIS — Z923 Personal history of irradiation: Secondary | ICD-10-CM | POA: Diagnosis not present

## 2019-12-16 DIAGNOSIS — C7951 Secondary malignant neoplasm of bone: Secondary | ICD-10-CM | POA: Diagnosis present

## 2019-12-16 DIAGNOSIS — Z791 Long term (current) use of non-steroidal anti-inflammatories (NSAID): Secondary | ICD-10-CM | POA: Diagnosis not present

## 2019-12-16 DIAGNOSIS — I10 Essential (primary) hypertension: Secondary | ICD-10-CM | POA: Insufficient documentation

## 2019-12-16 DIAGNOSIS — Z7952 Long term (current) use of systemic steroids: Secondary | ICD-10-CM | POA: Insufficient documentation

## 2019-12-16 DIAGNOSIS — C61 Malignant neoplasm of prostate: Secondary | ICD-10-CM

## 2019-12-16 DIAGNOSIS — Z79899 Other long term (current) drug therapy: Secondary | ICD-10-CM | POA: Diagnosis not present

## 2019-12-16 LAB — CBC WITH DIFFERENTIAL (CANCER CENTER ONLY)
Abs Immature Granulocytes: 0.02 10*3/uL (ref 0.00–0.07)
Basophils Absolute: 0 10*3/uL (ref 0.0–0.1)
Basophils Relative: 1 %
Eosinophils Absolute: 0.1 10*3/uL (ref 0.0–0.5)
Eosinophils Relative: 2 %
HCT: 36.3 % — ABNORMAL LOW (ref 39.0–52.0)
Hemoglobin: 12.4 g/dL — ABNORMAL LOW (ref 13.0–17.0)
Immature Granulocytes: 1 %
Lymphocytes Relative: 13 %
Lymphs Abs: 0.5 10*3/uL — ABNORMAL LOW (ref 0.7–4.0)
MCH: 32.5 pg (ref 26.0–34.0)
MCHC: 34.2 g/dL (ref 30.0–36.0)
MCV: 95.3 fL (ref 80.0–100.0)
Monocytes Absolute: 0.3 10*3/uL (ref 0.1–1.0)
Monocytes Relative: 9 %
Neutro Abs: 2.9 10*3/uL (ref 1.7–7.7)
Neutrophils Relative %: 74 %
Platelet Count: 228 10*3/uL (ref 150–400)
RBC: 3.81 MIL/uL — ABNORMAL LOW (ref 4.22–5.81)
RDW: 12.6 % (ref 11.5–15.5)
WBC Count: 3.9 10*3/uL — ABNORMAL LOW (ref 4.0–10.5)
nRBC: 0 % (ref 0.0–0.2)

## 2019-12-16 LAB — CMP (CANCER CENTER ONLY)
ALT: 11 U/L (ref 0–44)
AST: 17 U/L (ref 15–41)
Albumin: 4.1 g/dL (ref 3.5–5.0)
Alkaline Phosphatase: 44 U/L (ref 38–126)
Anion gap: 11 (ref 5–15)
BUN: 12 mg/dL (ref 6–20)
CO2: 25 mmol/L (ref 22–32)
Calcium: 9.5 mg/dL (ref 8.9–10.3)
Chloride: 99 mmol/L (ref 98–111)
Creatinine: 0.95 mg/dL (ref 0.61–1.24)
GFR, Est AFR Am: >60 mL/min (ref >60)
GFR, Estimated: >60 mL/min (ref >60)
Glucose, Bld: 86 mg/dL (ref 70–99)
Potassium: 3.5 mmol/L (ref 3.5–5.1)
Sodium: 135 mmol/L (ref 135–145)
Total Bilirubin: 0.4 mg/dL (ref 0.3–1.2)
Total Protein: 7.3 g/dL (ref 6.5–8.1)

## 2019-12-16 MED ORDER — DENOSUMAB 120 MG/1.7ML ~~LOC~~ SOLN
120.0000 mg | Freq: Once | SUBCUTANEOUS | Status: AC
  Administered 2019-12-16: 14:00:00 120 mg via SUBCUTANEOUS

## 2019-12-16 MED ORDER — DENOSUMAB 120 MG/1.7ML ~~LOC~~ SOLN
SUBCUTANEOUS | Status: AC
  Filled 2019-12-16: qty 1.7

## 2019-12-16 NOTE — Patient Instructions (Signed)
Denosumab injection What is this medicine? DENOSUMAB (den oh sue mab) slows bone breakdown. Prolia is used to treat osteoporosis in women after menopause and in men, and in people who are taking corticosteroids for 6 months or more. Xgeva is used to treat a high calcium level due to cancer and to prevent bone fractures and other bone problems caused by multiple myeloma or cancer bone metastases. Xgeva is also used to treat giant cell tumor of the bone. This medicine may be used for other purposes; ask your health care provider or pharmacist if you have questions. COMMON BRAND NAME(S): Prolia, XGEVA What should I tell my health care provider before I take this medicine? They need to know if you have any of these conditions:  dental disease  having surgery or tooth extraction  infection  kidney disease  low levels of calcium or Vitamin D in the blood  malnutrition  on hemodialysis  skin conditions or sensitivity  thyroid or parathyroid disease  an unusual reaction to denosumab, other medicines, foods, dyes, or preservatives  pregnant or trying to get pregnant  breast-feeding How should I use this medicine? This medicine is for injection under the skin. It is given by a health care professional in a hospital or clinic setting. A special MedGuide will be given to you before each treatment. Be sure to read this information carefully each time. For Prolia, talk to your pediatrician regarding the use of this medicine in children. Special care may be needed. For Xgeva, talk to your pediatrician regarding the use of this medicine in children. While this drug may be prescribed for children as young as 13 years for selected conditions, precautions do apply. Overdosage: If you think you have taken too much of this medicine contact a poison control center or emergency room at once. NOTE: This medicine is only for you. Do not share this medicine with others. What if I miss a dose? It is  important not to miss your dose. Call your doctor or health care professional if you are unable to keep an appointment. What may interact with this medicine? Do not take this medicine with any of the following medications:  other medicines containing denosumab This medicine may also interact with the following medications:  medicines that lower your chance of fighting infection  steroid medicines like prednisone or cortisone This list may not describe all possible interactions. Give your health care provider a list of all the medicines, herbs, non-prescription drugs, or dietary supplements you use. Also tell them if you smoke, drink alcohol, or use illegal drugs. Some items may interact with your medicine. What should I watch for while using this medicine? Visit your doctor or health care professional for regular checks on your progress. Your doctor or health care professional may order blood tests and other tests to see how you are doing. Call your doctor or health care professional for advice if you get a fever, chills or sore throat, or other symptoms of a cold or flu. Do not treat yourself. This drug may decrease your body's ability to fight infection. Try to avoid being around people who are sick. You should make sure you get enough calcium and vitamin D while you are taking this medicine, unless your doctor tells you not to. Discuss the foods you eat and the vitamins you take with your health care professional. See your dentist regularly. Brush and floss your teeth as directed. Before you have any dental work done, tell your dentist you are   receiving this medicine. Do not become pregnant while taking this medicine or for 5 months after stopping it. Talk with your doctor or health care professional about your birth control options while taking this medicine. Women should inform their doctor if they wish to become pregnant or think they might be pregnant. There is a potential for serious side  effects to an unborn child. Talk to your health care professional or pharmacist for more information. What side effects may I notice from receiving this medicine? Side effects that you should report to your doctor or health care professional as soon as possible:  allergic reactions like skin rash, itching or hives, swelling of the face, lips, or tongue  bone pain  breathing problems  dizziness  jaw pain, especially after dental work  redness, blistering, peeling of the skin  signs and symptoms of infection like fever or chills; cough; sore throat; pain or trouble passing urine  signs of low calcium like fast heartbeat, muscle cramps or muscle pain; pain, tingling, numbness in the hands or feet; seizures  unusual bleeding or bruising  unusually weak or tired Side effects that usually do not require medical attention (report to your doctor or health care professional if they continue or are bothersome):  constipation  diarrhea  headache  joint pain  loss of appetite  muscle pain  runny nose  tiredness  upset stomach This list may not describe all possible side effects. Call your doctor for medical advice about side effects. You may report side effects to FDA at 1-800-FDA-1088. Where should I keep my medicine? This medicine is only given in a clinic, doctor's office, or other health care setting and will not be stored at home. NOTE: This sheet is a summary. It may not cover all possible information. If you have questions about this medicine, talk to your doctor, pharmacist, or health care provider.  2020 Elsevier/Gold Standard (2018-03-07 16:10:44)  

## 2019-12-16 NOTE — Progress Notes (Signed)
Hematology and Oncology Follow Up Visit  LB MAUGHAN [id number] 1961/09/15 59 y.o. 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    Prior Therapy:  Prostate biopsy obtained in May 2019.  Radiation therapy to the prostate completed and November 2019.  He received 45 Gy in 25 fractions followed by 30 Gy in 15 fractions  Current therapy:  Lupron 30 mg every 4 months started in July 2019.  Next Eligard injection will be in April 2021.  Zytiga 1000 mg with prednisone 5 mg started in July 2019  Xgeva 120 mg every 8 weeks.  Interim History: Mr. Clendenin returns today for a follow-up visit.  Since last visit, he reports no major changes in his health.  He continues to tolerate Zytiga without any complaints.  He denies any nausea, vomiting or abdominal pain.  He denies any bone pain or pathological fractures.  He denies any worsening edema.         Medications: Updated on review. Current Outpatient Medications  Medication Sig Dispense Refill  . abiraterone acetate (ZYTIGA) 250 MG tablet TAKE 4 TABLETS BY MOUTH 1 TIME A DAY. TAKE ON AN EMPTY STOMACH 1 HOUR BEFORE OR 2 HOURS AFTER A MEAL. 120 tablet 0  . celecoxib (CELEBREX) 50 MG capsule Take 1 capsule (50 mg total) by mouth daily. 4 capsule 0  . fenofibrate 160 MG tablet TAKE 1 TABLET BY MOUTH ONCE A DAY FOR CHOLESTEROL  3  . fluconazole (DIFLUCAN) 100 MG tablet Take 1 tablet (100 mg total) by mouth daily. 5 tablet 0  . Multiple Vitamin (MULTIVITAMIN) tablet Take 1 tablet by mouth daily.    . naproxen sodium (ANAPROX) 220 MG tablet Take 220 mg by mouth 2 (two) times daily with a meal.    . OS-CAL CALCIUM + D3 500-200 MG-UNIT TABS TAKE 1 TABLET BY MOUTH TWICE A DAY 90 tablet 3  . predniSONE (DELTASONE) 5 MG tablet TAKE 1 TABLET BY MOUTH EVERY DAY WITH  BREAKFAST 90 tablet 3  . SUMAtriptan Succinate (ZEMBRACE SYMTOUCH) 3 MG/0.5ML SOAJ Inject 3 mg into the skin as directed. At earliest onset, may repeat in 2 hours if headache persists or reoccurs (Patient not taking: Reported on 10/22/2018) 4.5 mL 3  . valsartan-hydrochlorothiazide (DIOVAN-HCT) 160-12.5 MG tablet Take 1 tablet by mouth daily.  0   No current facility-administered medications for this visit.     Allergies: No Known Allergies     Physical Exam:    Blood pressure (!) 144/86, pulse 86, temperature (!) 97.5 F (36.4 C), temperature source Temporal, resp. rate 18, height 5\' 10"  (1.778 m), weight 177 lb 14.4 oz (80.7 kg), SpO2 98 %.      ECOG: 0   General appearance: Alert, awake without any distress. Head: Atraumatic without abnormalities Oropharynx: Without any thrush or ulcers. Eyes: No scleral icterus. Lymph nodes: No lymphadenopathy noted in the cervical, supraclavicular, or axillary nodes Heart:regular rate and rhythm, without any murmurs or gallops.   Lung: Clear to auscultation without any rhonchi, wheezes or dullness to percussion. Abdomin: Soft, nontender without any shifting dullness or ascites. Musculoskeletal: No clubbing or cyanosis. Neurological: No motor or sensory deficits. Skin: No rashes or lesions.   CBC    Component Value Date/Time   WBC 3.7 (L) 10/15/2019 1426   RBC 3.80 (L) 10/15/2019 1426   HGB  12.4 (L) 10/15/2019 1426   HCT 36.0 (L) 10/15/2019 1426   PLT 208 10/15/2019 1426   MCV 94.7 10/15/2019 1426   MCH 32.6 10/15/2019 1426   MCHC 34.4 10/15/2019 1426   RDW 12.6 10/15/2019 1426   LYMPHSABS 0.5 (L) 10/15/2019 1426   MONOABS 0.4 10/15/2019 1426   EOSABS 0.2 10/15/2019 1426   BASOSABS 0.0 10/15/2019 1426        Results for DANELLE, SANDSTROM (MRN [id number]) as of 12/16/2019 13:12  Ref. Range 08/06/2019 14:42 10/15/2019 14:26  Prostate Specific Ag, Serum Latest Ref Range: 0.0 - 4.0 ng/mL <0.1 <0.1       Impression and  Plan:    59 year old man with:  1.    Advanced prostate cancer with disease to the bone since 2019.  He has castration-sensitive disease currently.[name]     He has tolerated Zytiga without any major concerns at this time.  His PSA remains undetectable.  Risks and benefits of continuing this therapy versus discontinuation of Zytiga was discussed.  Alternative options especially in the setting of castration-resistant space was reviewed.  Treatment include systemic chemotherapy, Xofigo among others.  These will be deferred for the time being.  He is agreeable at this time.   2.  Androgen deprivation: Next Eligard will be in April 2021.  Potential complications were reviewed including weight gain and osteoporosis.  3.  Bone directed therapy: Xgeva received on December 3 of 2020.  Risks and benefits of repeat infusion was discussed today.  Potential complication including osteonecrosis of the jaw, hypocalcemia were reviewed.  4.  Liver function test and electrolyte follow-up: No abnormalities noted at this time on Zytiga.  These will be monitored periodically.    5.  Hypertension: We will continue to monitor his blood pressure on Zytiga given the risk of hypertension.  7.  Prognosis: Disease remains incurable although aggressive measures are warranted given his excellent performance status and excellent benefit.  8.  Follow-up: He will return in 2 months for Eligard and Xgeva.   30 minutes was dedicated to this encounter.  The time was spent on reviewing disease status, reviewing laboratory data, complications noted therapy and future plan of care.     Zola Button, MD 2/3/20211:10 PM

## 2019-12-16 NOTE — Telephone Encounter (Signed)
Scheduled per 02/03 los, patient received calender.

## 2019-12-17 ENCOUNTER — Telehealth: Payer: Self-pay

## 2019-12-17 LAB — PROSTATE-SPECIFIC AG, SERUM (LABCORP): Prostate Specific Ag, Serum: <0.1 ng/mL (ref 0.0–4.0)

## 2019-12-17 NOTE — Telephone Encounter (Signed)
-----   Message from Wyatt Portela, MD sent at 12/17/2019  8:18 AM EST ----- Please let him know his PSA is down

## 2019-12-17 NOTE — Telephone Encounter (Signed)
Called and left message stating levels were down. Instructed patient to call office for exact result.

## 2019-12-24 IMAGING — US US ABDOMEN COMPLETE
1 series · 14 of 25 positions shown · non-contrast
Comparison: None.

CLINICAL DATA: Elevated LFT

EXAM:
ABDOMEN ULTRASOUND COMPLETE

[Series 1: us abdomen complete · 0.24mm/px · 14 of 90 slices shown]
[im 1/90]
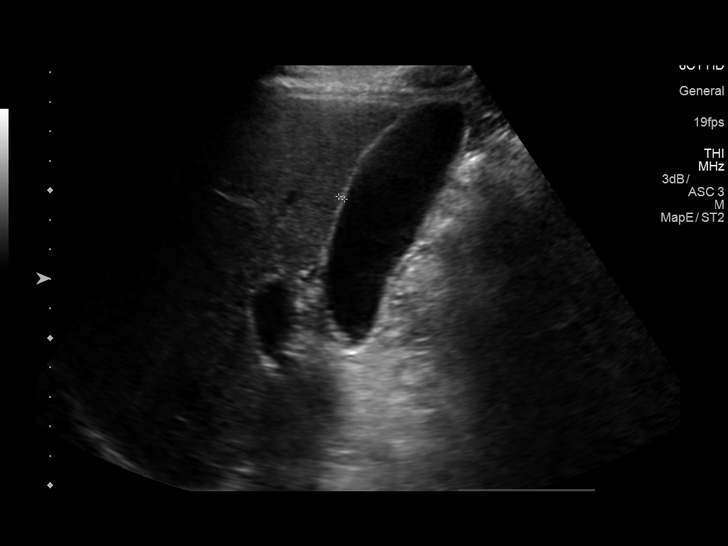
[im 8/90]
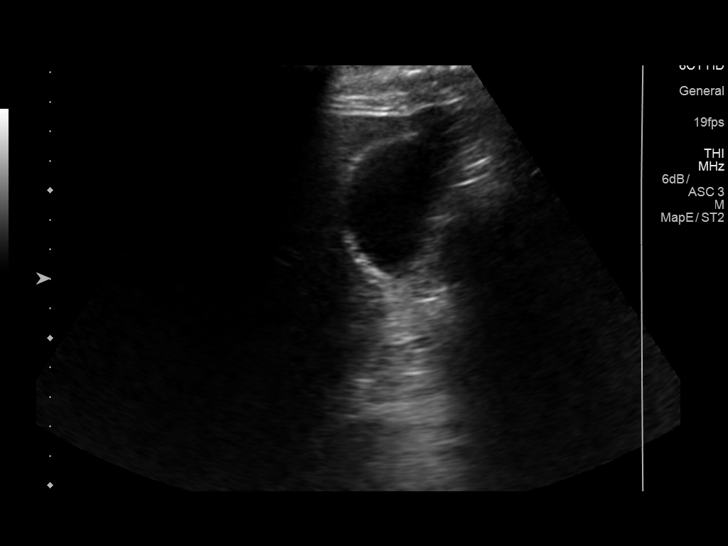
[im 15/90]
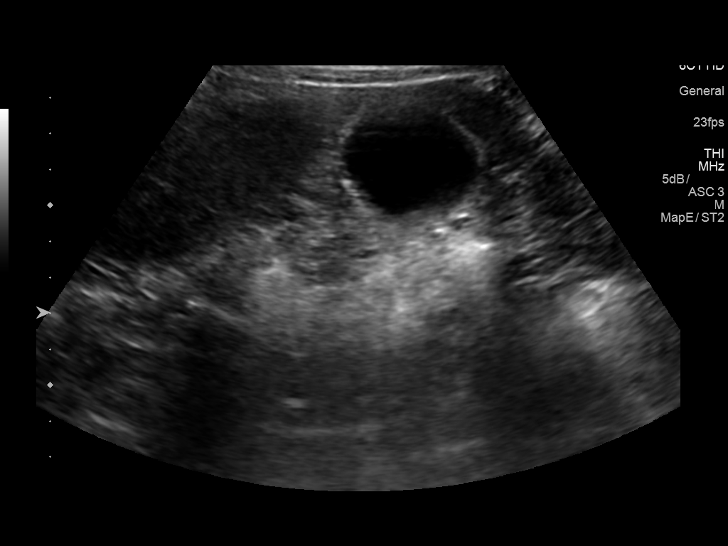
[im 23/90]
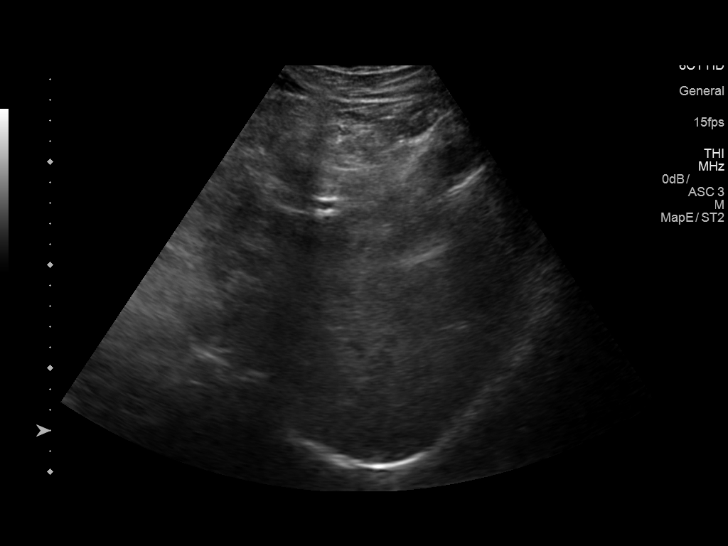
[im 30/90]
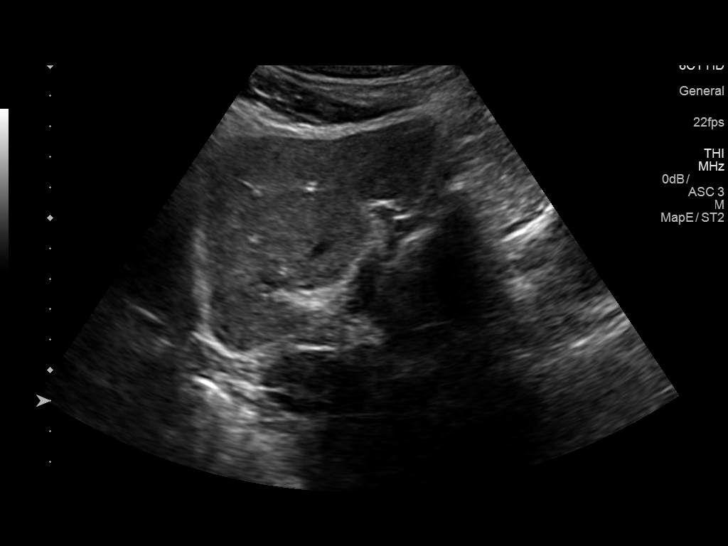
[im 34/90]
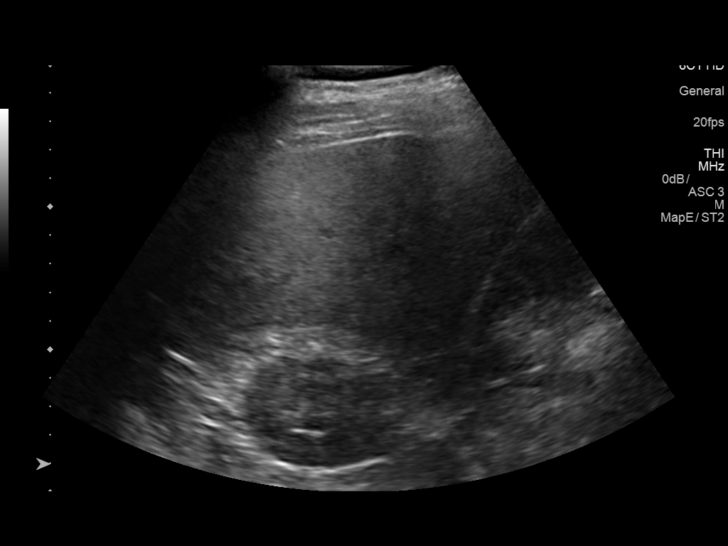
[im 41/90]
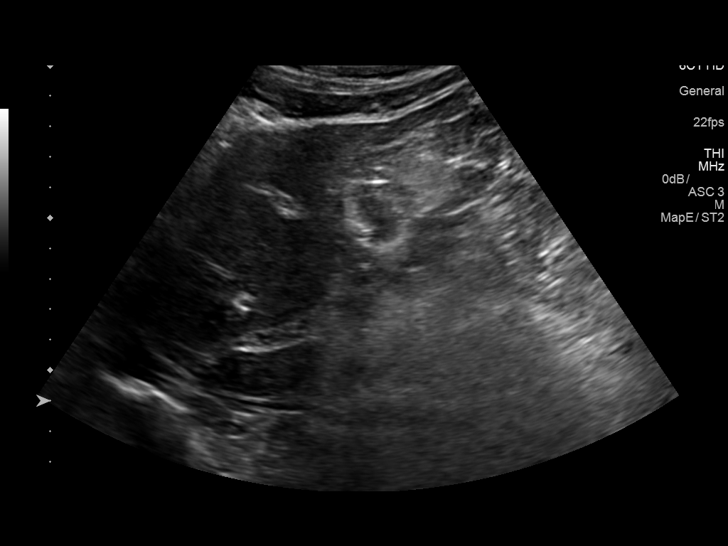
[im 49/90]
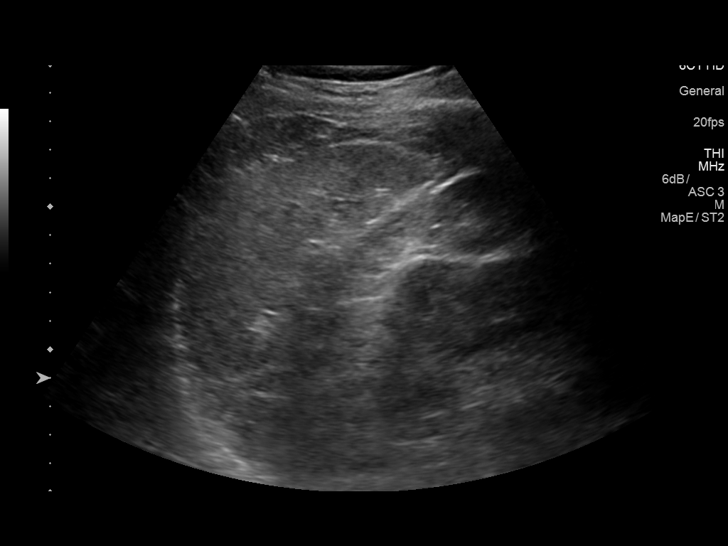
[im 56/90]
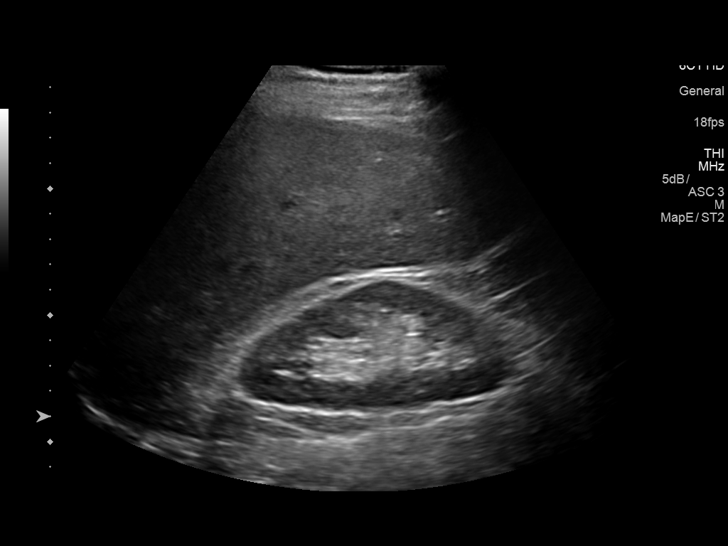
[im 60/90]
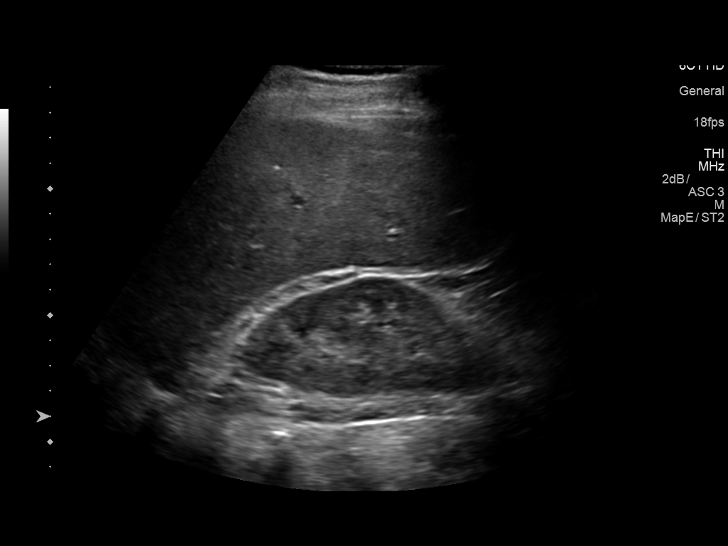
[im 67/90]
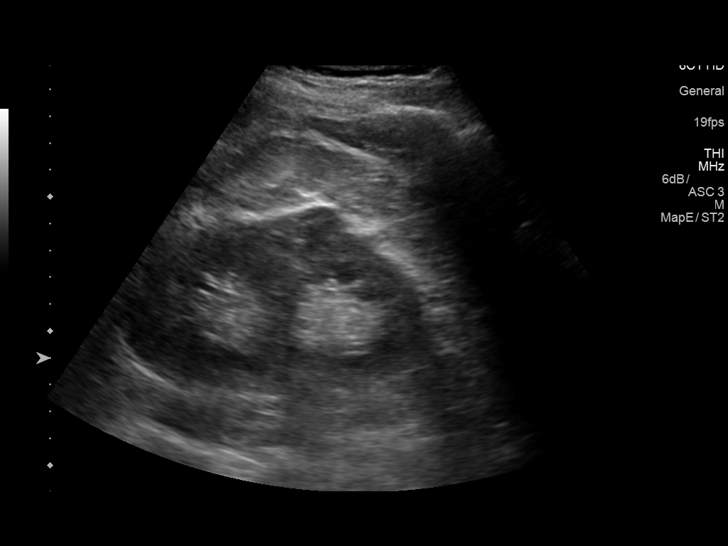
[im 75/90]
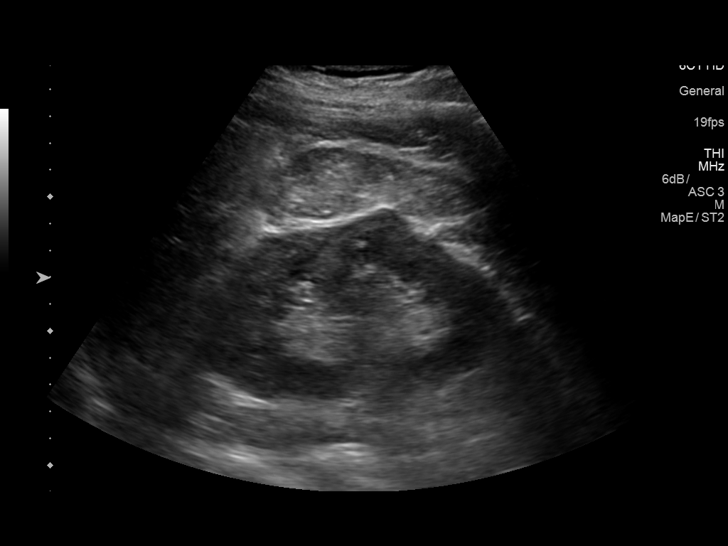
[im 82/90]
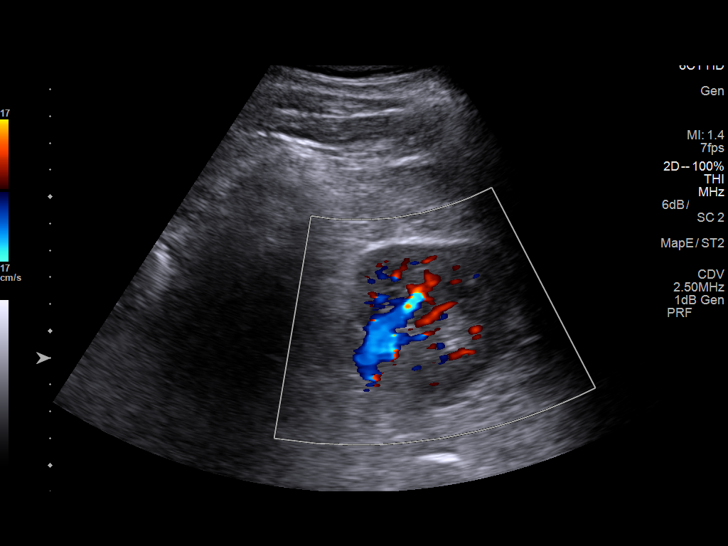
[im 90/90]
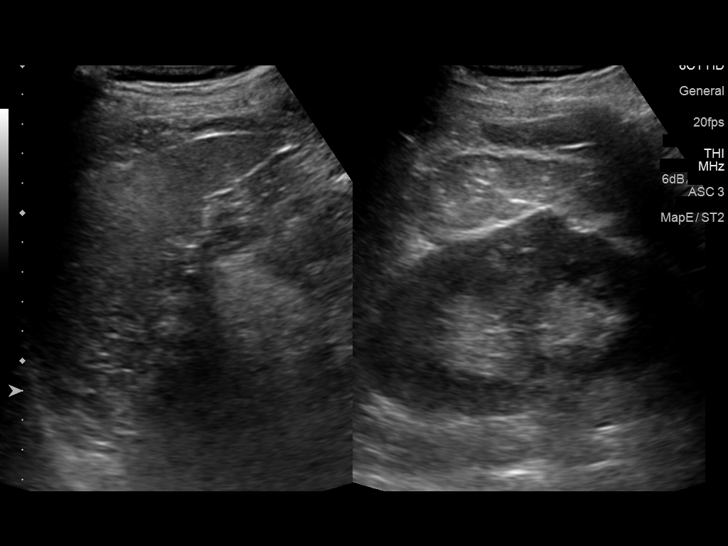

[14 of 25 positions shown; findings below may reference images not displayed]

FINDINGS: Gallbladder: No gallstones or wall thickening visualized. No
sonographic Murphy sign noted by sonographer.

Common bile duct: Diameter: 2.4 mm

Liver: Mildly increased echogenicity liver diffusely without focal
lesion. Portal vein is patent on color Doppler imaging with normal
direction of blood flow towards the liver.

IVC: No abnormality visualized.

Pancreas: Visualized portion unremarkable.

Spleen: Size and appearance within normal limits.

Right Kidney: Length: 10.8 cm. Echogenicity within normal limits. No
mass or hydronephrosis visualized.

Left Kidney: Length: 11.7 cm. 1 cm cyst midpole. Echogenicity within
normal limits. No mass or hydronephrosis visualized.

Abdominal aorta: No aneurysm visualized.

Other findings: None.
IMPRESSION: Negative for gallstones.  Mild hepatic steatosis

## 2020-02-05 NOTE — Progress Notes (Signed)
Pharmacist Chemotherapy Monitoring - Follow Up Assessment    I verify that I have reviewed each item in the below checklist:  . Regimen for the patient is scheduled for the appropriate day and plan matches scheduled date. [name] Appropriate non-routine labs are ordered dependent on drug ordered. . If applicable, additional medications reviewed and ordered per protocol based on lifetime cumulative doses and/or treatment regimen.   Plan for follow-up and/or issues identified: Yes . I-vent associated with next due treatment: Yes . MD and/or nursing notified: No  Dan Farrell 02/05/2020 1:26 PM

## 2020-02-08 ENCOUNTER — Other Ambulatory Visit: Payer: Self-pay | Admitting: Oncology

## 2020-02-08 DIAGNOSIS — C61 Malignant neoplasm of prostate: Secondary | ICD-10-CM

## 2020-02-11 ENCOUNTER — Inpatient Hospital Stay: Payer: BC Managed Care – PPO | Attending: Oncology

## 2020-02-11 ENCOUNTER — Inpatient Hospital Stay (HOSPITAL_BASED_OUTPATIENT_CLINIC_OR_DEPARTMENT_OTHER): Payer: BC Managed Care – PPO | Admitting: Oncology

## 2020-02-11 ENCOUNTER — Other Ambulatory Visit: Payer: Self-pay

## 2020-02-11 ENCOUNTER — Inpatient Hospital Stay: Payer: BC Managed Care – PPO

## 2020-02-11 VITALS — BP 136/88 | HR 89 | Temp 98.9°F | Resp 20 | Ht 70.0 in | Wt 176.9 lb

## 2020-02-11 DIAGNOSIS — C7951 Secondary malignant neoplasm of bone: Secondary | ICD-10-CM | POA: Insufficient documentation

## 2020-02-11 DIAGNOSIS — Z79899 Other long term (current) drug therapy: Secondary | ICD-10-CM | POA: Diagnosis not present

## 2020-02-11 DIAGNOSIS — C61 Malignant neoplasm of prostate: Secondary | ICD-10-CM | POA: Insufficient documentation

## 2020-02-11 LAB — CBC WITH DIFFERENTIAL (CANCER CENTER ONLY)
Abs Immature Granulocytes: 0.02 10*3/uL (ref 0.00–0.07)
Basophils Absolute: 0 10*3/uL (ref 0.0–0.1)
Basophils Relative: 1 %
Eosinophils Absolute: 0.1 10*3/uL (ref 0.0–0.5)
Eosinophils Relative: 3 %
HCT: 35.6 % — ABNORMAL LOW (ref 39.0–52.0)
Hemoglobin: 12.3 g/dL — ABNORMAL LOW (ref 13.0–17.0)
Immature Granulocytes: 1 %
Lymphocytes Relative: 14 %
Lymphs Abs: 0.5 10*3/uL — ABNORMAL LOW (ref 0.7–4.0)
MCH: 33 pg (ref 26.0–34.0)
MCHC: 34.6 g/dL (ref 30.0–36.0)
MCV: 95.4 fL (ref 80.0–100.0)
Monocytes Absolute: 0.4 10*3/uL (ref 0.1–1.0)
Monocytes Relative: 10 %
Neutro Abs: 2.6 10*3/uL (ref 1.7–7.7)
Neutrophils Relative %: 71 %
Platelet Count: 202 10*3/uL (ref 150–400)
RBC: 3.73 MIL/uL — ABNORMAL LOW (ref 4.22–5.81)
RDW: 12.5 % (ref 11.5–15.5)
WBC Count: 3.5 10*3/uL — ABNORMAL LOW (ref 4.0–10.5)
nRBC: 0 % (ref 0.0–0.2)

## 2020-02-11 LAB — CMP (CANCER CENTER ONLY)
ALT: 12 U/L (ref 0–44)
AST: 17 U/L (ref 15–41)
Albumin: 3.9 g/dL (ref 3.5–5.0)
Alkaline Phosphatase: 47 U/L (ref 38–126)
Anion gap: 12 (ref 5–15)
BUN: 12 mg/dL (ref 6–20)
CO2: 24 mmol/L (ref 22–32)
Calcium: 9.1 mg/dL (ref 8.9–10.3)
Chloride: 102 mmol/L (ref 98–111)
Creatinine: 0.95 mg/dL (ref 0.61–1.24)
GFR, Est AFR Am: >60 mL/min (ref >60)
GFR, Estimated: >60 mL/min (ref >60)
Glucose, Bld: 89 mg/dL (ref 70–99)
Potassium: 3.9 mmol/L (ref 3.5–5.1)
Sodium: 138 mmol/L (ref 135–145)
Total Bilirubin: 0.3 mg/dL (ref 0.3–1.2)
Total Protein: 7.4 g/dL (ref 6.5–8.1)

## 2020-02-11 MED ORDER — LEUPROLIDE ACETATE (4 MONTH) 30 MG ~~LOC~~ KIT
30.0000 mg | PACK | Freq: Once | SUBCUTANEOUS | Status: AC
  Administered 2020-02-11: 16:00:00 30 mg via SUBCUTANEOUS
  Filled 2020-02-11: qty 30

## 2020-02-11 MED ORDER — DENOSUMAB 120 MG/1.7ML ~~LOC~~ SOLN
SUBCUTANEOUS | Status: AC
  Filled 2020-02-11: qty 1.7

## 2020-02-11 MED ORDER — DENOSUMAB 120 MG/1.7ML ~~LOC~~ SOLN
120.0000 mg | Freq: Once | SUBCUTANEOUS | Status: AC
  Administered 2020-02-11: 120 mg via SUBCUTANEOUS

## 2020-02-11 NOTE — Patient Instructions (Signed)
Denosumab injection What is this medicine? DENOSUMAB (den oh sue mab) slows bone breakdown. Prolia is used to treat osteoporosis in women after menopause and in men, and in people who are taking corticosteroids for 6 months or more. Xgeva is used to treat a high calcium level due to cancer and to prevent bone fractures and other bone problems caused by multiple myeloma or cancer bone metastases. Xgeva is also used to treat giant cell tumor of the bone. This medicine may be used for other purposes; ask your health care provider or pharmacist if you have questions. COMMON BRAND NAME(S): Prolia, XGEVA What should I tell my health care provider before I take this medicine? They need to know if you have any of these conditions:  dental disease  having surgery or tooth extraction  infection  kidney disease  low levels of calcium or Vitamin D in the blood  malnutrition  on hemodialysis  skin conditions or sensitivity  thyroid or parathyroid disease  an unusual reaction to denosumab, other medicines, foods, dyes, or preservatives  pregnant or trying to get pregnant  breast-feeding How should I use this medicine? This medicine is for injection under the skin. It is given by a health care professional in a hospital or clinic setting. A special MedGuide will be given to you before each treatment. Be sure to read this information carefully each time. For Prolia, talk to your pediatrician regarding the use of this medicine in children. Special care may be needed. For Xgeva, talk to your pediatrician regarding the use of this medicine in children. While this drug may be prescribed for children as young as 13 years for selected conditions, precautions do apply. Overdosage: If you think you have taken too much of this medicine contact a poison control center or emergency room at once. NOTE: This medicine is only for you. Do not share this medicine with others. What if I miss a dose? It is  important not to miss your dose. Call your doctor or health care professional if you are unable to keep an appointment. What may interact with this medicine? Do not take this medicine with any of the following medications:  other medicines containing denosumab This medicine may also interact with the following medications:  medicines that lower your chance of fighting infection  steroid medicines like prednisone or cortisone This list may not describe all possible interactions. Give your health care provider a list of all the medicines, herbs, non-prescription drugs, or dietary supplements you use. Also tell them if you smoke, drink alcohol, or use illegal drugs. Some items may interact with your medicine. What should I watch for while using this medicine? Visit your doctor or health care professional for regular checks on your progress. Your doctor or health care professional may order blood tests and other tests to see how you are doing. Call your doctor or health care professional for advice if you get a fever, chills or sore throat, or other symptoms of a cold or flu. Do not treat yourself. This drug may decrease your body's ability to fight infection. Try to avoid being around people who are sick. You should make sure you get enough calcium and vitamin D while you are taking this medicine, unless your doctor tells you not to. Discuss the foods you eat and the vitamins you take with your health care professional. See your dentist regularly. Brush and floss your teeth as directed. Before you have any dental work done, tell your dentist you are   receiving this medicine. Do not become pregnant while taking this medicine or for 5 months after stopping it. Talk with your doctor or health care professional about your birth control options while taking this medicine. Women should inform their doctor if they wish to become pregnant or think they might be pregnant. There is a potential for serious side  effects to an unborn child. Talk to your health care professional or pharmacist for more information. What side effects may I notice from receiving this medicine? Side effects that you should report to your doctor or health care professional as soon as possible:  allergic reactions like skin rash, itching or hives, swelling of the face, lips, or tongue  bone pain  breathing problems  dizziness  jaw pain, especially after dental work  redness, blistering, peeling of the skin  signs and symptoms of infection like fever or chills; cough; sore throat; pain or trouble passing urine  signs of low calcium like fast heartbeat, muscle cramps or muscle pain; pain, tingling, numbness in the hands or feet; seizures  unusual bleeding or bruising  unusually weak or tired Side effects that usually do not require medical attention (report to your doctor or health care professional if they continue or are bothersome):  constipation  diarrhea  headache  joint pain  loss of appetite  muscle pain  runny nose  tiredness  upset stomach This list may not describe all possible side effects. Call your doctor for medical advice about side effects. You may report side effects to FDA at 1-800-FDA-1088. Where should I keep my medicine? This medicine is only given in a clinic, doctor's office, or other health care setting and will not be stored at home. NOTE: This sheet is a summary. It may not cover all possible information. If you have questions about this medicine, talk to your doctor, pharmacist, or health care provider.  2020 Elsevier/Gold Standard (2018-03-07 16:10:44) Leuprolide injection What is this medicine? LEUPROLIDE (loo PROE lide) is a man-made hormone. It is used to treat the symptoms of prostate cancer. This medicine may also be used to treat children with early onset of puberty. It may be used for other hormonal conditions. This medicine may be used for other purposes; ask your  health care provider or pharmacist if you have questions. COMMON BRAND NAME(S): Lupron What should I tell my health care provider before I take this medicine? They need to know if you have any of these conditions:  diabetes  heart disease or previous heart attack  high blood pressure  high cholesterol  pain or difficulty passing urine  spinal cord metastasis  stroke  tobacco smoker  an unusual or allergic reaction to leuprolide, benzyl alcohol, other medicines, foods, dyes, or preservatives  pregnant or trying to get pregnant  breast-feeding How should I use this medicine? This medicine is for injection under the skin or into a muscle. You will be taught how to prepare and give this medicine. Use exactly as directed. Take your medicine at regular intervals. Do not take your medicine more often than directed. It is important that you put your used needles and syringes in a special sharps container. Do not put them in a trash can. If you do not have a sharps container, call your pharmacist or healthcare provider to get one. A special MedGuide will be given to you by the pharmacist with each prescription and refill. Be sure to read this information carefully each time. Talk to your pediatrician regarding the use of  this medicine in children. While this medicine may be prescribed for children as young as 8 years for selected conditions, precautions do apply. Overdosage: If you think you have taken too much of this medicine contact a poison control center or emergency room at once. NOTE: This medicine is only for you. Do not share this medicine with others. What if I miss a dose? If you miss a dose, take it as soon as you can. If it is almost time for your next dose, take only that dose. Do not take double or extra doses. What may interact with this medicine? Do not take this medicine with any of the following medications:  chasteberry This medicine may also interact with the  following medications:  herbal or dietary supplements, like black cohosh or DHEA  male hormones, like estrogens or progestins and birth control pills, patches, rings, or injections  male hormones, like testosterone This list may not describe all possible interactions. Give your health care provider a list of all the medicines, herbs, non-prescription drugs, or dietary supplements you use. Also tell them if you smoke, drink alcohol, or use illegal drugs. Some items may interact with your medicine. What should I watch for while using this medicine? Visit your doctor or health care professional for regular checks on your progress. During the first week, your symptoms may get worse, but then will improve as you continue your treatment. You may get hot flashes, increased bone pain, increased difficulty passing urine, or an aggravation of nerve symptoms. Discuss these effects with your doctor or health care professional, some of them may improve with continued use of this medicine. Male patients may experience a menstrual cycle or spotting during the first 2 months of therapy with this medicine. If this continues, contact your doctor or health care professional. This medicine may increase blood sugar. Ask your healthcare provider if changes in diet or medicines are needed if you have diabetes. What side effects may I notice from receiving this medicine? Side effects that you should report to your doctor or health care professional as soon as possible:  allergic reactions like skin rash, itching or hives, swelling of the face, lips, or tongue  breathing problems  chest pain  depression or memory disorders  pain in your legs or groin  pain at site where injected  severe headache  signs and symptoms of high blood sugar such as being more thirsty or hungry or having to urinate more than normal. You may also feel very tired or have blurry vision  swelling of the feet and legs  visual  changes  vomiting Side effects that usually do not require medical attention (report to your doctor or health care professional if they continue or are bothersome):  breast swelling or tenderness  decrease in sex drive or performance  diarrhea  hot flashes  loss of appetite  muscle, joint, or bone pains  nausea  redness or irritation at site where injected  skin problems or acne This list may not describe all possible side effects. Call your doctor for medical advice about side effects. You may report side effects to FDA at 1-800-FDA-1088. Where should I keep my medicine? Keep out of the reach of children. Store below 25 degrees C (77 degrees F). Do not freeze. Protect from light. Do not use if it is not clear or if there are particles present. Throw away any unused medicine after the expiration date. NOTE: This sheet is a summary. It may not cover all   possible information. If you have questions about this medicine, talk to your doctor, pharmacist, or health care provider.  2020 Elsevier/Gold Standard (2018-08-28 09:52:48)

## 2020-02-11 NOTE — Progress Notes (Signed)
Hematology and Oncology Follow Up Visit  Dan Farrell [id number] 01-Sep-1961 59 y.o. 02/11/2020 2:57 PM [name], [name], MDEhinger, [name], MD   Principle Diagnosis: 59 year old man with advanced prostate cancer and disease to the bone diagnosed in 2019.  He was found to have castration-sensitive PSA of 11 Gleason score 4+4 = 8 at that time.   Prior Therapy:  Prostate biopsy obtained in May 2019.  Radiation therapy to the prostate completed and November 2019.  He received 45 Gy in 25 fractions followed by 30 Gy in 15 fractions  Current therapy:  Lupron 30 mg every 4 months started in July 2019.  Next Eligard injection will be in April 2021.  Zytiga 1000 mg with prednisone 5 mg started in July 2019  Xgeva 120 mg every 8 weeks.  Interim History: Mr. Trucks is here for a follow-up visit.  Since the last visit, he reports no major changes in his health.  He continues to tolerate Zytiga without any issues at this time.  He denies any nausea, vomiting or abdominal pain.  He eats reasonably well and maintains his weight.  Continues to work full-time without any issues.  He continues to report some hot flashes associated with Eligard but manageable at this time.        Medications: Unchanged on review. Current Outpatient Medications  Medication Sig Dispense Refill  . abiraterone acetate (ZYTIGA) 250 MG tablet TAKE 4 TABLETS BY MOUTH 1 TIME A DAY. TAKE ON AN EMPTY STOMACH 1 HOUR BEFORE OR 2 HOURS AFTER A MEAL. 120 tablet 0  . celecoxib (CELEBREX) 50 MG capsule Take 1 capsule (50 mg total) by mouth daily. 4 capsule 0  . fenofibrate 160 MG tablet TAKE 1 TABLET BY MOUTH ONCE A DAY FOR CHOLESTEROL  3  . fluconazole (DIFLUCAN) 100 MG tablet Take 1 tablet (100 mg total) by mouth daily. 5 tablet 0  . Multiple Vitamin (MULTIVITAMIN) tablet Take 1 tablet by mouth daily.    . naproxen sodium (ANAPROX) 220 MG tablet Take 220 mg by mouth 2 (two) times daily with a meal.    . OS-CAL CALCIUM +  D3 500-200 MG-UNIT TABS TAKE 1 TABLET BY MOUTH TWICE A DAY 90 tablet 3  . predniSONE (DELTASONE) 5 MG tablet TAKE 1 TABLET BY MOUTH EVERY DAY WITH BREAKFAST 90 tablet 3  . SUMAtriptan Succinate (ZEMBRACE SYMTOUCH) 3 MG/0.5ML SOAJ Inject 3 mg into the skin as directed. At earliest onset, may repeat in 2 hours if headache persists or reoccurs (Patient not taking: Reported on 10/22/2018) 4.5 mL 3  . valsartan-hydrochlorothiazide (DIOVAN-HCT) 160-12.5 MG tablet Take 1 tablet by mouth daily.  0   No current facility-administered medications for this visit.     Allergies: No Known Allergies     Physical Exam:     Blood pressure 136/88, pulse 89, temperature 98.9 F (37.2 C), temperature source Temporal, resp. rate 20, height 5\' 10"  (1.778 m), weight 176 lb 14.4 oz (80.2 kg), SpO2 99 %.      ECOG: 0     General appearance: Comfortable appearing without any discomfort Head: Normocephalic without any trauma Oropharynx: Mucous membranes are moist and pink without any thrush or ulcers. Eyes: Pupils are equal and round reactive to light. Lymph nodes: No cervical, supraclavicular, inguinal or axillary lymphadenopathy.   Heart:regular rate and rhythm.  S1 and S2 without leg edema. Lung: Clear without any rhonchi or wheezes.  No dullness to percussion. Abdomin: Soft, nontender, nondistended with good bowel sounds.  No hepatosplenomegaly.  Musculoskeletal: No joint deformity or effusion.  Full range of motion noted. Neurological: No deficits noted on motor, sensory and deep tendon reflex exam. Skin: No petechial rash or dryness.  Appeared moist.      CBC    Component Value Date/Time   WBC 3.9 (L) 12/16/2019 1302   RBC 3.81 (L) 12/16/2019 1302   HGB 12.4 (L) 12/16/2019 1302   HCT 36.3 (L) 12/16/2019 1302   PLT 228 12/16/2019 1302   MCV 95.3 12/16/2019 1302   MCH 32.5 12/16/2019 1302   MCHC 34.2 12/16/2019 1302   RDW 12.6 12/16/2019 1302   LYMPHSABS 0.5 (L) 12/16/2019 1302    MONOABS 0.3 12/16/2019 1302   EOSABS 0.1 12/16/2019 1302   BASOSABS 0.0 12/16/2019 1302       Results for KIDUS, BESTUL (MRN [id number]) as of 02/11/2020 14:59  Ref. Range 10/15/2019 14:26 12/16/2019 13:02  Prostate Specific Ag, Serum Latest Ref Range: 0.0 - 4.0 ng/mL <0.1 <0.1         Impression and Plan:    59 year old man with:  1.    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   He continues to tolerate Zytiga without any major concerns at this time.  PSA remains undetectable.  Risks and benefits of continuing this treatment long-term versus continuing androgen deprivation therapy alone were reviewed.  Complications such as hypertension, adrenal insufficiency as well as hypokalemia were reiterated.  He is agreeable to continue.   2.  Androgen deprivation: He will receive Eligard today and repeated in 4 months.  Complications such as hot flashes, weight gain among others were reviewed.  3.  Bone directed therapy: He will receive Xgeva today and repeated every 2 months.  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  Continue to urge him to take calcium supplements  4.  Liver function test surveillance: Liver function test and electrolytes remain pending.  His potassium did not require any replacement at this time.  5.  Hypertension: No issues reported with his blood pressure at this time.  We will continue to monitor on Zytiga.  7.  Prognosis: Therapy remains palliative although his performance status is excellent and his disease is responding reasonably well and aggressive measures are warranted.  8.  Follow-up: In 2 months for a follow-up evaluation including Xgeva   30 minutes were dedicated to this visit.  The time was dedicated to discussing the natural course of his disease, reviewing treatment options, complications with therapy and future plan of care.    Zola Button, MD 4/1/20212:57  PM

## 2020-02-12 ENCOUNTER — Telehealth: Payer: Self-pay

## 2020-02-12 ENCOUNTER — Telehealth: Payer: Self-pay | Admitting: Oncology

## 2020-02-12 LAB — PROSTATE-SPECIFIC AG, SERUM (LABCORP): Prostate Specific Ag, Serum: <0.1 ng/mL (ref 0.0–4.0)

## 2020-02-12 NOTE — Telephone Encounter (Signed)
Scheduled per los. Called and spoke with patient. Confirmed appt 

## 2020-02-12 NOTE — Telephone Encounter (Signed)
-----   Message from Wyatt Portela, MD sent at 02/12/2020  8:20 AM EDT ----- Please let him know his PSA is still low

## 2020-02-12 NOTE — Telephone Encounter (Signed)
Called and informed patient of PSA level. Patient verbalized understanding and had no questions.

## 2020-04-06 ENCOUNTER — Other Ambulatory Visit: Payer: Self-pay | Admitting: Oncology

## 2020-04-08 ENCOUNTER — Other Ambulatory Visit: Payer: Self-pay | Admitting: Oncology

## 2020-04-08 DIAGNOSIS — C61 Malignant neoplasm of prostate: Secondary | ICD-10-CM

## 2020-04-14 ENCOUNTER — Inpatient Hospital Stay: Payer: BC Managed Care – PPO

## 2020-04-14 ENCOUNTER — Inpatient Hospital Stay: Payer: BC Managed Care – PPO | Attending: Oncology | Admitting: Oncology

## 2020-04-14 ENCOUNTER — Other Ambulatory Visit: Payer: Self-pay

## 2020-04-14 VITALS — BP 144/88 | HR 89 | Temp 97.9°F | Resp 18 | Ht 70.0 in | Wt 175.7 lb

## 2020-04-14 DIAGNOSIS — C7951 Secondary malignant neoplasm of bone: Secondary | ICD-10-CM | POA: Diagnosis present

## 2020-04-14 DIAGNOSIS — C61 Malignant neoplasm of prostate: Secondary | ICD-10-CM | POA: Insufficient documentation

## 2020-04-14 DIAGNOSIS — Z79899 Other long term (current) drug therapy: Secondary | ICD-10-CM | POA: Insufficient documentation

## 2020-04-14 DIAGNOSIS — Z923 Personal history of irradiation: Secondary | ICD-10-CM | POA: Insufficient documentation

## 2020-04-14 LAB — CMP (CANCER CENTER ONLY)
ALT: 11 U/L (ref 0–44)
AST: 18 U/L (ref 15–41)
Albumin: 3.9 g/dL (ref 3.5–5.0)
Alkaline Phosphatase: 48 U/L (ref 38–126)
Anion gap: 10 (ref 5–15)
BUN: 13 mg/dL (ref 6–20)
CO2: 26 mmol/L (ref 22–32)
Calcium: 9.2 mg/dL (ref 8.9–10.3)
Chloride: 103 mmol/L (ref 98–111)
Creatinine: 0.94 mg/dL (ref 0.61–1.24)
GFR, Est AFR Am: >60 mL/min (ref >60)
GFR, Estimated: >60 mL/min (ref >60)
Glucose, Bld: 88 mg/dL (ref 70–99)
Potassium: 3.9 mmol/L (ref 3.5–5.1)
Sodium: 139 mmol/L (ref 135–145)
Total Bilirubin: 0.3 mg/dL (ref 0.3–1.2)
Total Protein: 7.3 g/dL (ref 6.5–8.1)

## 2020-04-14 LAB — CBC WITH DIFFERENTIAL (CANCER CENTER ONLY)
Abs Immature Granulocytes: 0.02 10*3/uL (ref 0.00–0.07)
Basophils Absolute: 0 10*3/uL (ref 0.0–0.1)
Basophils Relative: 1 %
Eosinophils Absolute: 0.1 10*3/uL (ref 0.0–0.5)
Eosinophils Relative: 2 %
HCT: 35.4 % — ABNORMAL LOW (ref 39.0–52.0)
Hemoglobin: 12.2 g/dL — ABNORMAL LOW (ref 13.0–17.0)
Immature Granulocytes: 1 %
Lymphocytes Relative: 13 %
Lymphs Abs: 0.6 10*3/uL — ABNORMAL LOW (ref 0.7–4.0)
MCH: 33.2 pg (ref 26.0–34.0)
MCHC: 34.5 g/dL (ref 30.0–36.0)
MCV: 96.2 fL (ref 80.0–100.0)
Monocytes Absolute: 0.3 10*3/uL (ref 0.1–1.0)
Monocytes Relative: 8 %
Neutro Abs: 3.4 10*3/uL (ref 1.7–7.7)
Neutrophils Relative %: 75 %
Platelet Count: 213 10*3/uL (ref 150–400)
RBC: 3.68 MIL/uL — ABNORMAL LOW (ref 4.22–5.81)
RDW: 12.3 % (ref 11.5–15.5)
WBC Count: 4.4 10*3/uL (ref 4.0–10.5)
nRBC: 0 % (ref 0.0–0.2)

## 2020-04-14 MED ORDER — DENOSUMAB 120 MG/1.7ML ~~LOC~~ SOLN
120.0000 mg | Freq: Once | SUBCUTANEOUS | Status: AC
  Administered 2020-04-14: 120 mg via SUBCUTANEOUS

## 2020-04-14 MED ORDER — EPOETIN ALFA-EPBX 10000 UNIT/ML IJ SOLN
INTRAMUSCULAR | Status: AC
  Filled 2020-04-14: qty 2

## 2020-04-14 NOTE — Progress Notes (Signed)
Hematology and Oncology Follow Up Visit  Dan Farrell [id number] 07/09/1961 59 y.o. 04/14/2020 2:55 PM Dan Farrell, Dan Farrell, MDEhinger, Dan [name], MD   Principle Diagnosis: 59 year old man with castration-sensitive prostate cancer presented with PSA of 11 Gleason score 4+4 = 8 and disease to the bone in 2019.     Prior Therapy:  Prostate biopsy obtained in May 2019.  Radiation therapy to the prostate completed and November 2019.  He received 45 Gy in 25 fractions followed by 30 Gy in 15 fractions  Current therapy:  Lupron 30 mg every 4 months started in July 2019.  Next Eligard injection will be in August 2021.  Zytiga 1000 mg with prednisone 5 mg started in July 2019  Xgeva 120 mg every 8 weeks.  Interim History: Dan Farrell returns today for repeat evaluation.  Since the last visit, he reports no major changes in his health.  He denies any recent hospitalization or illnesses.  He denies any complications related to Zytiga.  He denies any excessive fatigue tiredness or weight loss.  He does report loss of muscle mass and periodically hot flashes and fatigue.  He denies any bone pain or decline in his energy.        Medications: Reviewed without changes. Current Outpatient Medications  Medication Sig Dispense Refill  . abiraterone acetate (ZYTIGA) 250 MG tablet TAKE 4 TABLETS BY MOUTH 1 TIME A DAY. TAKE ON AN EMPTY STOMACH 1 HOUR BEFORE OR 2 HOURS AFTER A MEAL. 120 tablet 0  . celecoxib (CELEBREX) 50 MG capsule Take 1 capsule (50 mg total) by mouth daily. 4 capsule 0  . fenofibrate 160 MG tablet TAKE 1 TABLET BY MOUTH ONCE A DAY FOR CHOLESTEROL  3  . fluconazole (DIFLUCAN) 100 MG tablet Take 1 tablet (100 mg total) by mouth daily. 5 tablet 0  . Multiple Vitamin (MULTIVITAMIN) tablet Take 1 tablet by mouth daily.    . naproxen sodium (ANAPROX) 220 MG tablet Take 220 mg by mouth 2 (two) times daily with a meal.    . OS-CAL CALCIUM + D3 500-200 MG-UNIT TABS TAKE 1 TABLET BY MOUTH  TWICE A DAY 90 tablet 3  . predniSONE (DELTASONE) 5 MG tablet TAKE 1 TABLET BY MOUTH EVERY DAY WITH BREAKFAST 90 tablet 3  . SUMAtriptan Succinate (ZEMBRACE SYMTOUCH) 3 MG/0.5ML SOAJ Inject 3 mg into the skin as directed. At earliest onset, may repeat in 2 hours if headache persists or reoccurs (Patient not taking: Reported on 10/22/2018) 4.5 mL 3  . valsartan-hydrochlorothiazide (DIOVAN-HCT) 160-12.5 MG tablet Take 1 tablet by mouth daily.  0   No current facility-administered medications for this visit.     Allergies: No Known Allergies     Physical Exam:     Blood pressure (!) 144/88, pulse 89, temperature 97.9 F (36.6 C), temperature source Temporal, resp. rate 18, height 5\' 10"  (1.778 m), weight 175 lb 11.2 oz (79.7 kg), SpO2 98 %.      ECOG: 0   General appearance: Alert, awake without any distress. Head: Atraumatic without abnormalities Oropharynx: Without any thrush or ulcers. Eyes: No scleral icterus. Lymph nodes: No lymphadenopathy noted in the cervical, supraclavicular, or axillary nodes Heart:regular rate and rhythm, without any murmurs or gallops.   Lung: Clear to auscultation without any rhonchi, wheezes or dullness to percussion. Abdomin: Soft, nontender without any shifting dullness or ascites. Musculoskeletal: No clubbing or cyanosis. Neurological: No motor or sensory deficits. Skin: No rashes or lesions.     CBC    Component  Value Date/Time   WBC 4.4 04/14/2020 1435   RBC 3.68 (L) 04/14/2020 1435   HGB 12.2 (L) 04/14/2020 1435   HCT 35.4 (L) 04/14/2020 1435   PLT 213 04/14/2020 1435   MCV 96.2 04/14/2020 1435   MCH 33.2 04/14/2020 1435   MCHC 34.5 04/14/2020 1435   RDW 12.3 04/14/2020 1435   LYMPHSABS 0.6 (L) 04/14/2020 1435   MONOABS 0.3 04/14/2020 1435   EOSABS 0.1 04/14/2020 1435   BASOSABS 0.0 04/14/2020 1435      Results for Dan Farrell (MRN [id number]) as of 04/14/2020 14:58  Ref. Range 12/16/2019 13:02 02/11/2020 14:48   Prostate Specific Ag, Serum Latest Ref Range: 0.0 - 4.0 ng/mL <0.1 <0.1           Impression and Plan:    59 year old man with:  1.    Advanced prostate cancer with disease to the bone noted in 2019.  He has castration-sensitive at that time.   The natural course of his disease was updated at this time with his PSA continues to be undetectable.  He continues to tolerate Zytiga without any complaints at this time.  Longstanding complications related to this medication was reviewed including edema and adrenal insufficiency.  Issues with hypertension and hypokalemia were also reiterated.  At the time being is agreeable to continue.   2.  Androgen deprivation: Long-term complications including hot flashes, weight gain osteoporosis were reviewed.  He will receive next Eligard injection in August 2021 is agreeable to proceed.  3.  Bone directed therapy: He has no complication related to Xgeva.  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  He is agreeable to proceed today.  4.  Liver function and electrolyte surveillance: He continues to have normal liver function test and potassium level.  We will continue to monitor on Zytiga.  5.  Hypertension: Blood pressure is within normal range and will continue to monitor on Zytiga.  7.  Prognosis: His disease remains incurable although aggressive measures are warranted given his excellent performance status and young age.  8.  Follow-up: He will return in 2 months for repeat evaluation.   30 minutes were spent on this encounter.  The time was dedicated to reviewing his disease that is, reviewing complications of therapy and future plan of care discussion.    Zola Button, MD 6/3/20212:55 PM

## 2020-04-14 NOTE — Patient Instructions (Signed)
Denosumab injection What is this medicine? DENOSUMAB (den oh sue mab) slows bone breakdown. Prolia is used to treat osteoporosis in women after menopause and in men, and in people who are taking corticosteroids for 6 months or more. Xgeva is used to treat a high calcium level due to cancer and to prevent bone fractures and other bone problems caused by multiple myeloma or cancer bone metastases. Xgeva is also used to treat giant cell tumor of the bone. This medicine may be used for other purposes; ask your health care provider or pharmacist if you have questions. COMMON BRAND NAME(S): Prolia, XGEVA What should I tell my health care provider before I take this medicine? They need to know if you have any of these conditions:  dental disease  having surgery or tooth extraction  infection  kidney disease  low levels of calcium or Vitamin D in the blood  malnutrition  on hemodialysis  skin conditions or sensitivity  thyroid or parathyroid disease  an unusual reaction to denosumab, other medicines, foods, dyes, or preservatives  pregnant or trying to get pregnant  breast-feeding How should I use this medicine? This medicine is for injection under the skin. It is given by a health care professional in a hospital or clinic setting. A special MedGuide will be given to you before each treatment. Be sure to read this information carefully each time. For Prolia, talk to your pediatrician regarding the use of this medicine in children. Special care may be needed. For Xgeva, talk to your pediatrician regarding the use of this medicine in children. While this drug may be prescribed for children as young as 13 years for selected conditions, precautions do apply. Overdosage: If you think you have taken too much of this medicine contact a poison control center or emergency room at once. NOTE: This medicine is only for you. Do not share this medicine with others. What if I miss a dose? It is  important not to miss your dose. Call your doctor or health care professional if you are unable to keep an appointment. What may interact with this medicine? Do not take this medicine with any of the following medications:  other medicines containing denosumab This medicine may also interact with the following medications:  medicines that lower your chance of fighting infection  steroid medicines like prednisone or cortisone This list may not describe all possible interactions. Give your health care provider a list of all the medicines, herbs, non-prescription drugs, or dietary supplements you use. Also tell them if you smoke, drink alcohol, or use illegal drugs. Some items may interact with your medicine. What should I watch for while using this medicine? Visit your doctor or health care professional for regular checks on your progress. Your doctor or health care professional may order blood tests and other tests to see how you are doing. Call your doctor or health care professional for advice if you get a fever, chills or sore throat, or other symptoms of a cold or flu. Do not treat yourself. This drug may decrease your body's ability to fight infection. Try to avoid being around people who are sick. You should make sure you get enough calcium and vitamin D while you are taking this medicine, unless your doctor tells you not to. Discuss the foods you eat and the vitamins you take with your health care professional. See your dentist regularly. Brush and floss your teeth as directed. Before you have any dental work done, tell your dentist you are   receiving this medicine. Do not become pregnant while taking this medicine or for 5 months after stopping it. Talk with your doctor or health care professional about your birth control options while taking this medicine. Women should inform their doctor if they wish to become pregnant or think they might be pregnant. There is a potential for serious side  effects to an unborn child. Talk to your health care professional or pharmacist for more information. What side effects may I notice from receiving this medicine? Side effects that you should report to your doctor or health care professional as soon as possible:  allergic reactions like skin rash, itching or hives, swelling of the face, lips, or tongue  bone pain  breathing problems  dizziness  jaw pain, especially after dental work  redness, blistering, peeling of the skin  signs and symptoms of infection like fever or chills; cough; sore throat; pain or trouble passing urine  signs of low calcium like fast heartbeat, muscle cramps or muscle pain; pain, tingling, numbness in the hands or feet; seizures  unusual bleeding or bruising  unusually weak or tired Side effects that usually do not require medical attention (report to your doctor or health care professional if they continue or are bothersome):  constipation  diarrhea  headache  joint pain  loss of appetite  muscle pain  runny nose  tiredness  upset stomach This list may not describe all possible side effects. Call your doctor for medical advice about side effects. You may report side effects to FDA at 1-800-FDA-1088. Where should I keep my medicine? This medicine is only given in a clinic, doctor's office, or other health care setting and will not be stored at home. NOTE: This sheet is a summary. It may not cover all possible information. If you have questions about this medicine, talk to your doctor, pharmacist, or health care provider.  2020 Elsevier/Gold Standard (2018-03-07 16:10:44)

## 2020-04-15 ENCOUNTER — Telehealth: Payer: Self-pay

## 2020-04-15 ENCOUNTER — Telehealth: Payer: Self-pay | Admitting: Oncology

## 2020-04-15 LAB — PROSTATE-SPECIFIC AG, SERUM (LABCORP): Prostate Specific Ag, Serum: <0.1 ng/mL (ref 0.0–4.0)

## 2020-04-15 NOTE — Telephone Encounter (Signed)
TC to Pt per Dr Alen Blew left VM message to Pt that his PSA remains low. Informed Pt on vm message if any questions or concerns to give the office a return call.

## 2020-04-15 NOTE — Telephone Encounter (Signed)
-----   Message from Wyatt Portela, MD sent at 04/15/2020  8:52 AM EDT ----- Please let him know his PSA is still low

## 2020-04-15 NOTE — Telephone Encounter (Signed)
Scheduled appt per 6/3 los.  Left a vm of the appt date and time. 

## 2020-06-02 ENCOUNTER — Other Ambulatory Visit: Payer: Self-pay | Admitting: Oncology

## 2020-06-02 ENCOUNTER — Other Ambulatory Visit: Payer: Self-pay

## 2020-06-02 DIAGNOSIS — C61 Malignant neoplasm of prostate: Secondary | ICD-10-CM

## 2020-06-02 MED ORDER — ABIRATERONE ACETATE 250 MG PO TABS: ORAL_TABLET | ORAL | 0 refills | Status: DC

## 2020-06-03 ENCOUNTER — Other Ambulatory Visit: Payer: Self-pay

## 2020-06-03 DIAGNOSIS — C61 Malignant neoplasm of prostate: Secondary | ICD-10-CM

## 2020-06-03 MED ORDER — ABIRATERONE ACETATE 250 MG PO TABS: ORAL_TABLET | ORAL | 0 refills | Status: DC

## 2020-06-13 ENCOUNTER — Other Ambulatory Visit: Payer: Self-pay | Admitting: Oncology

## 2020-06-13 DIAGNOSIS — C61 Malignant neoplasm of prostate: Secondary | ICD-10-CM

## 2020-06-14 ENCOUNTER — Inpatient Hospital Stay: Payer: BC Managed Care – PPO

## 2020-06-14 ENCOUNTER — Other Ambulatory Visit: Payer: Self-pay

## 2020-06-14 ENCOUNTER — Inpatient Hospital Stay (HOSPITAL_BASED_OUTPATIENT_CLINIC_OR_DEPARTMENT_OTHER): Payer: BC Managed Care – PPO | Admitting: Oncology

## 2020-06-14 ENCOUNTER — Inpatient Hospital Stay: Payer: BC Managed Care – PPO | Attending: Oncology

## 2020-06-14 VITALS — BP 147/87 | HR 86 | Temp 97.3°F | Resp 18 | Ht 70.0 in | Wt 176.3 lb

## 2020-06-14 DIAGNOSIS — Z79899 Other long term (current) drug therapy: Secondary | ICD-10-CM | POA: Insufficient documentation

## 2020-06-14 DIAGNOSIS — Z923 Personal history of irradiation: Secondary | ICD-10-CM | POA: Diagnosis not present

## 2020-06-14 DIAGNOSIS — C61 Malignant neoplasm of prostate: Secondary | ICD-10-CM

## 2020-06-14 DIAGNOSIS — C7951 Secondary malignant neoplasm of bone: Secondary | ICD-10-CM | POA: Diagnosis present

## 2020-06-14 LAB — CMP (CANCER CENTER ONLY)
ALT: 10 U/L (ref 0–44)
AST: 16 U/L (ref 15–41)
Albumin: 3.9 g/dL (ref 3.5–5.0)
Alkaline Phosphatase: 42 U/L (ref 38–126)
Anion gap: 8 (ref 5–15)
BUN: 14 mg/dL (ref 6–20)
CO2: 27 mmol/L (ref 22–32)
Calcium: 10 mg/dL (ref 8.9–10.3)
Chloride: 100 mmol/L (ref 98–111)
Creatinine: 0.93 mg/dL (ref 0.61–1.24)
GFR, Est AFR Am: >60 mL/min (ref >60)
GFR, Estimated: >60 mL/min (ref >60)
Glucose, Bld: 84 mg/dL (ref 70–99)
Potassium: 3.8 mmol/L (ref 3.5–5.1)
Sodium: 135 mmol/L (ref 135–145)
Total Bilirubin: 0.4 mg/dL (ref 0.3–1.2)
Total Protein: 7.3 g/dL (ref 6.5–8.1)

## 2020-06-14 LAB — CBC WITH DIFFERENTIAL (CANCER CENTER ONLY)
Abs Immature Granulocytes: 0.02 10*3/uL (ref 0.00–0.07)
Basophils Absolute: 0 10*3/uL (ref 0.0–0.1)
Basophils Relative: 0 %
Eosinophils Absolute: 0.1 10*3/uL (ref 0.0–0.5)
Eosinophils Relative: 2 %
HCT: 33.8 % — ABNORMAL LOW (ref 39.0–52.0)
Hemoglobin: 11.7 g/dL — ABNORMAL LOW (ref 13.0–17.0)
Immature Granulocytes: 1 %
Lymphocytes Relative: 13 %
Lymphs Abs: 0.5 10*3/uL — ABNORMAL LOW (ref 0.7–4.0)
MCH: 32.7 pg (ref 26.0–34.0)
MCHC: 34.6 g/dL (ref 30.0–36.0)
MCV: 94.4 fL (ref 80.0–100.0)
Monocytes Absolute: 0.4 10*3/uL (ref 0.1–1.0)
Monocytes Relative: 10 %
Neutro Abs: 2.7 10*3/uL (ref 1.7–7.7)
Neutrophils Relative %: 74 %
Platelet Count: 231 10*3/uL (ref 150–400)
RBC: 3.58 MIL/uL — ABNORMAL LOW (ref 4.22–5.81)
RDW: 12.4 % (ref 11.5–15.5)
WBC Count: 3.6 10*3/uL — ABNORMAL LOW (ref 4.0–10.5)
nRBC: 0 % (ref 0.0–0.2)

## 2020-06-14 MED ORDER — LEUPROLIDE ACETATE (4 MONTH) 30 MG ~~LOC~~ KIT
30.0000 mg | PACK | Freq: Once | SUBCUTANEOUS | Status: AC
  Administered 2020-06-14: 30 mg via SUBCUTANEOUS

## 2020-06-14 MED ORDER — LEUPROLIDE ACETATE (4 MONTH) 30 MG ~~LOC~~ KIT
PACK | SUBCUTANEOUS | Status: AC
  Filled 2020-06-14: qty 30

## 2020-06-14 MED ORDER — DENOSUMAB 120 MG/1.7ML ~~LOC~~ SOLN
120.0000 mg | Freq: Once | SUBCUTANEOUS | Status: AC
  Administered 2020-06-14: 120 mg via SUBCUTANEOUS

## 2020-06-14 MED ORDER — DENOSUMAB 120 MG/1.7ML ~~LOC~~ SOLN
SUBCUTANEOUS | Status: AC
  Filled 2020-06-14: qty 1.7

## 2020-06-14 NOTE — Progress Notes (Signed)
Hematology and Oncology Follow Up Visit  Dan Farrell [id number] 10-17-1961 59 y.o. 06/14/2020 2:50 PM Dan Farrell, [name], MDEhinger, [name], MD   Principle Diagnosis: 59 year old man with advanced prostate cancer with disease to the bone diagnosed in 2019.  He has castration-sensitive after he was found to have PSA of 11 Gleason score 4+4 = 8 at that time.   Prior Therapy:  Prostate biopsy obtained in May 2019.  Radiation therapy to the prostate completed and November 2019.  He received 45 Gy in 25 fractions followed by 30 Gy in 15 fractions  Current therapy:  Lupron 30 mg every 4 months started in July 2019.  He will receive Eligard today and repeated in 4 months.  Zytiga 1000 mg with prednisone 5 mg started in July 2019  Xgeva 120 mg every 8 weeks.  Interim History: [name] presents today for a follow-up visit.  Since last visit, he reports no major changes in his health.  He continues to be active and works full-time.  He has reported lower back pain that has been overall chronic in nature although has gotten worse as of late.  He was evaluated by orthopedic surgeon and was started on prednisone taper.  He did have a plain film x-ray still which did not show any pathological fractures but did confirm the presence of arthritis.  He denies any neurological deficits or weakness.  Continues to spend a lot of time on his feet part of his work.   He denies any complications related to Zytiga.  He denies any lower extremity edema or excessive tiredness or any recent hospitalizations.     Medications: Reviewed without changes. Current Outpatient Medications  Medication Sig Dispense Refill  . abiraterone acetate (ZYTIGA) 250 MG tablet TAKE 4 TABLETS BY MOUTH 1 TIME A DAY ON AN EMPTY STOMACH 1 HOUR BEFORE OR 2 HOURS AFTER A MEAL. 120 tablet 0  . celecoxib (CELEBREX) 50 MG capsule Take 1 capsule (50 mg total) by mouth daily. 4 capsule 0  . fenofibrate 160 MG tablet TAKE 1 TABLET BY  MOUTH ONCE A DAY FOR CHOLESTEROL  3  . fluconazole (DIFLUCAN) 100 MG tablet Take 1 tablet (100 mg total) by mouth daily. 5 tablet 0  . Multiple Vitamin (MULTIVITAMIN) tablet Take 1 tablet by mouth daily.    . naproxen sodium (ANAPROX) 220 MG tablet Take 220 mg by mouth 2 (two) times daily with a meal.    . OS-CAL CALCIUM + D3 500-200 MG-UNIT TABS TAKE 1 TABLET BY MOUTH TWICE A DAY 90 tablet 3  . predniSONE (DELTASONE) 5 MG tablet TAKE 1 TABLET BY MOUTH EVERY DAY WITH BREAKFAST 90 tablet 3  . SUMAtriptan Succinate (ZEMBRACE SYMTOUCH) 3 MG/0.5ML SOAJ Inject 3 mg into the skin as directed. At earliest onset, may repeat in 2 hours if headache persists or reoccurs (Patient not taking: Reported on 10/22/2018) 4.5 mL 3  . valsartan-hydrochlorothiazide (DIOVAN-HCT) 160-12.5 MG tablet Take 1 tablet by mouth daily.  0   No current facility-administered medications for this visit.     Allergies: No Known Allergies     Physical Exam:     Blood pressure (!) 147/87, pulse 86, temperature (!) 97.3 F (36.3 C), temperature source Temporal, resp. rate 18, height 5\' 10"  (1.778 m), weight 176 lb 4.8 oz (80 kg), SpO2 100 %.      ECOG: 0    General appearance: Comfortable appearing without any discomfort Head: Normocephalic without any trauma Oropharynx: Mucous membranes are moist and pink  without any thrush or ulcers. Eyes: Pupils are equal and round reactive to light. Lymph nodes: No cervical, supraclavicular, inguinal or axillary lymphadenopathy.   Heart:regular rate and rhythm.  S1 and S2 without leg edema. Lung: Clear without any rhonchi or wheezes.  No dullness to percussion. Abdomin: Soft, nontender, nondistended with good bowel sounds.  No hepatosplenomegaly. Musculoskeletal: No joint deformity or effusion.  Full range of motion noted. Neurological: No deficits noted on motor, sensory and deep tendon reflex exam. Skin: No petechial rash or dryness.  Appeared moist.      CBC     Component Value Date/Time   WBC 3.6 (L) 06/14/2020 1410   RBC 3.58 (L) 06/14/2020 1410   HGB 11.7 (L) 06/14/2020 1410   HCT 33.8 (L) 06/14/2020 1410   PLT 231 06/14/2020 1410   MCV 94.4 06/14/2020 1410   MCH 32.7 06/14/2020 1410   MCHC 34.6 06/14/2020 1410   RDW 12.4 06/14/2020 1410   LYMPHSABS 0.5 (L) 06/14/2020 1410   MONOABS 0.4 06/14/2020 1410   EOSABS 0.1 06/14/2020 1410   BASOSABS 0.0 06/14/2020 1410        Results for Dan Farrell, Dan Farrell (MRN [id number]) as of 06/14/2020 14:37  Ref. Range 02/11/2020 14:48 04/14/2020 14:35  Prostate Specific Ag, Serum Latest Ref Range: 0.0 - 4.0 ng/mL <0.1 <0.1          Impression and Plan:    59 year old man with:  1.    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     He continues to be on Zytiga with excellent PSA response at this time.  The natural course of this disease was reviewed and different treatment options including systemic chemotherapy were reiterated.  He does have worsening back pain although it appears to be arthritic in nature and less likely given his undetectable PSA.  For the time being we will continue the same dose and schedule without any changes.  2.  Androgen deprivation: I recommended continuing Eligard for the time being.  Long-term complications including weight gain, hot flashes were reviewed and he is agreeable to continue.  3.  Bone directed therapy: He tolerated Xgeva very well without any complaints.  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  4.  Liver function and electrolyte surveillance: No issues reported with his potassium and liver function test.  We will continue to monitor.  5.  Hypertension: No issues reported with his blood pressure and will continue to monitor on Zytiga.  7.  Prognosis: Therapy remains palliative although aggressive measures are warranted given his excellent performance status.  8.  Follow-up: In 2 months for  repeat follow-up.   30 minutes were dedicated to this visit.  The time was spent on reviewing his disease status, treatment options and outlining future plan of care.    Zola Button, MD 8/3/20212:50 PM

## 2020-06-15 ENCOUNTER — Telehealth: Payer: Self-pay | Admitting: Oncology

## 2020-06-15 ENCOUNTER — Telehealth: Payer: Self-pay

## 2020-06-15 LAB — PROSTATE-SPECIFIC AG, SERUM (LABCORP): Prostate Specific Ag, Serum: <0.1 ng/mL (ref 0.0–4.0)

## 2020-06-15 NOTE — Telephone Encounter (Signed)
Scheduled per 08/03 los, patient has been called and notified. 

## 2020-06-15 NOTE — Telephone Encounter (Signed)
Per Dr. Alen Blew  TC to Pt.,  Informed Pt. PSA results remain low. Pt verbalized understanding. No further problems or concerns noted.

## 2020-06-15 NOTE — Telephone Encounter (Signed)
-----   Message from Wyatt Portela, MD sent at 06/15/2020  8:48 AM EDT ----- Please let him know his PSA is still low

## 2020-08-12 ENCOUNTER — Other Ambulatory Visit: Payer: Self-pay

## 2020-08-12 DIAGNOSIS — C61 Malignant neoplasm of prostate: Secondary | ICD-10-CM

## 2020-08-12 MED ORDER — ABIRATERONE ACETATE 250 MG PO TABS: ORAL_TABLET | ORAL | 0 refills | Status: DC

## 2020-08-16 ENCOUNTER — Other Ambulatory Visit: Payer: Self-pay

## 2020-08-16 ENCOUNTER — Inpatient Hospital Stay: Payer: BC Managed Care – PPO | Attending: Oncology

## 2020-08-16 ENCOUNTER — Inpatient Hospital Stay: Payer: BC Managed Care – PPO

## 2020-08-16 ENCOUNTER — Inpatient Hospital Stay (HOSPITAL_BASED_OUTPATIENT_CLINIC_OR_DEPARTMENT_OTHER): Payer: BC Managed Care – PPO | Admitting: Oncology

## 2020-08-16 VITALS — BP 137/88 | HR 82 | Temp 97.9°F | Resp 18 | Ht 70.0 in | Wt 173.2 lb

## 2020-08-16 DIAGNOSIS — C61 Malignant neoplasm of prostate: Secondary | ICD-10-CM | POA: Insufficient documentation

## 2020-08-16 DIAGNOSIS — Z923 Personal history of irradiation: Secondary | ICD-10-CM | POA: Diagnosis not present

## 2020-08-16 DIAGNOSIS — I10 Essential (primary) hypertension: Secondary | ICD-10-CM | POA: Insufficient documentation

## 2020-08-16 DIAGNOSIS — C7951 Secondary malignant neoplasm of bone: Secondary | ICD-10-CM | POA: Diagnosis present

## 2020-08-16 DIAGNOSIS — M25559 Pain in unspecified hip: Secondary | ICD-10-CM | POA: Insufficient documentation

## 2020-08-16 DIAGNOSIS — Z79890 Hormone replacement therapy (postmenopausal): Secondary | ICD-10-CM | POA: Insufficient documentation

## 2020-08-16 DIAGNOSIS — Z79899 Other long term (current) drug therapy: Secondary | ICD-10-CM | POA: Diagnosis not present

## 2020-08-16 LAB — CBC WITH DIFFERENTIAL (CANCER CENTER ONLY)
Abs Immature Granulocytes: 0.01 10*3/uL (ref 0.00–0.07)
Basophils Absolute: 0 10*3/uL (ref 0.0–0.1)
Basophils Relative: 0 %
Eosinophils Absolute: 0.1 10*3/uL (ref 0.0–0.5)
Eosinophils Relative: 2 %
HCT: 34.8 % — ABNORMAL LOW (ref 39.0–52.0)
Hemoglobin: 12 g/dL — ABNORMAL LOW (ref 13.0–17.0)
Immature Granulocytes: 0 %
Lymphocytes Relative: 11 %
Lymphs Abs: 0.6 10*3/uL — ABNORMAL LOW (ref 0.7–4.0)
MCH: 32 pg (ref 26.0–34.0)
MCHC: 34.5 g/dL (ref 30.0–36.0)
MCV: 92.8 fL (ref 80.0–100.0)
Monocytes Absolute: 0.4 10*3/uL (ref 0.1–1.0)
Monocytes Relative: 7 %
Neutro Abs: 4.5 10*3/uL (ref 1.7–7.7)
Neutrophils Relative %: 80 %
Platelet Count: 229 10*3/uL (ref 150–400)
RBC: 3.75 MIL/uL — ABNORMAL LOW (ref 4.22–5.81)
RDW: 12.6 % (ref 11.5–15.5)
WBC Count: 5.6 10*3/uL (ref 4.0–10.5)
nRBC: 0 % (ref 0.0–0.2)

## 2020-08-16 LAB — CMP (CANCER CENTER ONLY)
ALT: 11 U/L (ref 0–44)
AST: 16 U/L (ref 15–41)
Albumin: 3.7 g/dL (ref 3.5–5.0)
Alkaline Phosphatase: 49 U/L (ref 38–126)
Anion gap: 6 (ref 5–15)
BUN: 9 mg/dL (ref 6–20)
CO2: 28 mmol/L (ref 22–32)
Calcium: 9.5 mg/dL (ref 8.9–10.3)
Chloride: 103 mmol/L (ref 98–111)
Creatinine: 0.85 mg/dL (ref 0.61–1.24)
GFR, Estimated: >60 mL/min (ref >60)
Glucose, Bld: 88 mg/dL (ref 70–99)
Potassium: 3.8 mmol/L (ref 3.5–5.1)
Sodium: 137 mmol/L (ref 135–145)
Total Bilirubin: 0.3 mg/dL (ref 0.3–1.2)
Total Protein: 6.9 g/dL (ref 6.5–8.1)

## 2020-08-16 MED ORDER — DENOSUMAB 120 MG/1.7ML ~~LOC~~ SOLN
120.0000 mg | Freq: Once | SUBCUTANEOUS | Status: AC
  Administered 2020-08-16: 120 mg via SUBCUTANEOUS

## 2020-08-16 MED ORDER — DENOSUMAB 120 MG/1.7ML ~~LOC~~ SOLN
SUBCUTANEOUS | Status: AC
  Filled 2020-08-16: qty 1.7

## 2020-08-16 NOTE — Progress Notes (Signed)
Hematology and Oncology Follow Up Visit  Dan Farrell [id number] 03/07/1961 59 y.o. 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  He presented with PSA of 11 Gleason score 4+4 = 8.    Prior Therapy:  Prostate biopsy obtained in May 2019.  Radiation therapy to the prostate completed and November 2019.  He received 45 Gy in 25 fractions followed by 30 Gy in 15 fractions  Current therapy:  Eligard 30 mg every 4 months.  Next injection will be given in December 2021.  Zytiga 1000 mg with prednisone 5 mg started in July 2019  Xgeva 120 mg every 8 weeks.  He will receive injection today and repeated in 2 months.  Interim History: Mr. Balaban returns today for a follow-up visit.  Since the last visit, he has reported increase pain in his hip without any recent injury.  He was evaluated by orthopedic surgery found to have arthritis documented by MRI.  No evidence of malignancy noted based on these imaging studies.  He did have an injection although did not help his pain.  He continues to tolerate Zytiga reasonably well without any complications.  He denies any nausea vomiting or abdominal pain.  He denies any edema.  Continues to work full-time.     Medications: Updated on review. Current Outpatient Medications  Medication Sig Dispense Refill  . abiraterone acetate (ZYTIGA) 250 MG tablet TAKE 4 TABLETS BY MOUTH 1 TIME A DAY ON AN EMPTY STOMACH 1 HOUR BEFORE OR 2 HOURS AFTER A MEAL. 120 tablet 0  . celecoxib (CELEBREX) 50 MG capsule Take 1 capsule (50 mg total) by mouth daily. 4 capsule 0  . fenofibrate 160 MG tablet TAKE 1 TABLET BY MOUTH ONCE A DAY FOR CHOLESTEROL  3  . fluconazole (DIFLUCAN) 100 MG tablet Take 1 tablet (100 mg total) by mouth daily. 5 tablet 0  . Multiple Vitamin (MULTIVITAMIN) tablet Take 1 tablet by mouth daily.    . naproxen  sodium (ANAPROX) 220 MG tablet Take 220 mg by mouth 2 (two) times daily with a meal.    . OS-CAL CALCIUM + D3 500-200 MG-UNIT TABS TAKE 1 TABLET BY MOUTH TWICE A DAY 90 tablet 3  . predniSONE (DELTASONE) 5 MG tablet TAKE 1 TABLET BY MOUTH EVERY DAY WITH BREAKFAST 90 tablet 3  . SUMAtriptan Succinate (ZEMBRACE SYMTOUCH) 3 MG/0.5ML SOAJ Inject 3 mg into the skin as directed. At earliest onset, may repeat in 2 hours if headache persists or reoccurs (Patient not taking: Reported on 10/22/2018) 4.5 mL 3  . valsartan-hydrochlorothiazide (DIOVAN-HCT) 160-12.5 MG tablet Take 1 tablet by mouth daily.  0   No current facility-administered medications for this visit.     Allergies: No Known Allergies     Physical Exam:  Blood pressure 137/88, pulse 82, temperature 97.9 F (36.6 C), temperature source Tympanic, resp. rate 18, height 5\' 10"  (1.778 m), weight 173 lb 3.2 oz (78.6 kg), SpO2 99 %.          ECOG: 0     General appearance: Alert, awake without any distress. Head: Atraumatic without abnormalities Oropharynx: Without any thrush or ulcers. Eyes: No scleral icterus. Lymph nodes: No lymphadenopathy noted in the cervical, supraclavicular, or axillary nodes Heart:regular rate and rhythm, without any murmurs or gallops.   Lung: Clear to auscultation without any rhonchi, wheezes or dullness to percussion. Abdomin: Soft, nontender without any shifting  dullness or ascites. Musculoskeletal: No clubbing or cyanosis. Neurological: No motor or sensory deficits. Skin: No rashes or lesions.       CBC    Component Value Date/Time   WBC 3.6 (L) 06/14/2020 1410   RBC 3.58 (L) 06/14/2020 1410   HGB 11.7 (L) 06/14/2020 1410   HCT 33.8 (L) 06/14/2020 1410   PLT 231 06/14/2020 1410   MCV 94.4 06/14/2020 1410   MCH 32.7 06/14/2020 1410   MCHC 34.6 06/14/2020 1410   RDW 12.4 06/14/2020 1410   LYMPHSABS 0.5 (L) 06/14/2020 1410   MONOABS 0.4 06/14/2020 1410   EOSABS 0.1 06/14/2020  1410   BASOSABS 0.0 06/14/2020 1410     Results for ANGELITO, HOPPING (MRN [id number]) as of 08/16/2020 14:30  Ref. Range 04/14/2020 14:35 06/14/2020 14:10  Prostate Specific Ag, Serum Latest Ref Range: 0.0 - 4.0 ng/mL <0.1 <0.1              Impression and Plan:    59 year old man with:  1.    Advanced prostate cancer with disease to bone diagnosed in 2019.  He has castration-sensitive disease.   His disease status was updated and his PSA continues to be undetectable.  Risks and benefits of continuing Zytiga were discussed.  Complication associated with this treatment including hypertension, weight gain and edema were discussed.  Alternative options such as systemic chemotherapy will be deferred unless he developed castration-resistant disease.  Updating his staging including CT scan and bone scan will be considered in the future.  2.  Androgen deprivation: His next Eligard is scheduled for December 2021.  Risks and benefits of continuing this therapy long-term were discussed.  Weight gain, hot flashes and osteoporosis were reviewed.  3.  Bone directed therapy: I continue to educate them about potential complications related to [facility].  These include hypocalcemia and osteonecrosis of the jaw.  He is agreeable to continue at this time.  4.  Liver function and electrolyte surveillance: Laboratory data continues to show normal electrolytes and liver function test.  5.  Hypertension: His blood pressure remains under excellent control for the time being.  6.  Hip pain: Appears to be arthritis related and not malignancy.  He continues to follow with orthopedics.  7.  Prognosis: His disease remains incurable although aggressive measures are warranted given his excellent performance status.  8.  Follow-up: In 8 weeks for repeat follow-up.   30 minutes were spent on this encounter.  The time was dedicated to reviewing his treatment options and addressing complications related to  current therapy.    Zola Button, MD 10/5/20212:43 PM

## 2020-08-17 ENCOUNTER — Telehealth: Payer: Self-pay

## 2020-08-17 LAB — PROSTATE-SPECIFIC AG, SERUM (LABCORP): Prostate Specific Ag, Serum: <0.1 ng/mL (ref 0.0–4.0)

## 2020-08-17 NOTE — Telephone Encounter (Signed)
Called patient and let him know PSA is low. Patient verbalized understanding.

## 2020-08-17 NOTE — Telephone Encounter (Signed)
-----   Message from Wyatt Portela, MD sent at 08/17/2020  8:32 AM EDT ----- Please let him know his PSA is low.

## 2020-08-29 ENCOUNTER — Other Ambulatory Visit: Payer: Self-pay | Admitting: Oncology

## 2020-08-29 DIAGNOSIS — C61 Malignant neoplasm of prostate: Secondary | ICD-10-CM

## 2020-09-30 ENCOUNTER — Telehealth: Payer: Self-pay | Admitting: *Deleted

## 2020-09-30 NOTE — Telephone Encounter (Signed)
Dan Farrell states he is having a hip replacement on 12/20. Dr Alvan Dame was going to send a request for medical clearance.  Dan Farrell wants to know if Dr Alen Blew wants him to stop any medications prior to surgery.

## 2020-09-30 NOTE — Telephone Encounter (Signed)
No medications needs to be stopped.

## 2020-09-30 NOTE — Telephone Encounter (Signed)
Notified that there are no medications to be stopped

## 2020-10-11 ENCOUNTER — Other Ambulatory Visit: Payer: Self-pay | Admitting: Oncology

## 2020-10-11 DIAGNOSIS — C61 Malignant neoplasm of prostate: Secondary | ICD-10-CM

## 2020-10-19 ENCOUNTER — Inpatient Hospital Stay: Payer: BC Managed Care – PPO

## 2020-10-19 ENCOUNTER — Inpatient Hospital Stay: Payer: BC Managed Care – PPO | Admitting: Oncology

## 2020-10-25 ENCOUNTER — Other Ambulatory Visit: Payer: Self-pay

## 2020-10-25 ENCOUNTER — Inpatient Hospital Stay: Payer: BC Managed Care – PPO

## 2020-10-25 ENCOUNTER — Inpatient Hospital Stay: Payer: BC Managed Care – PPO | Attending: Oncology

## 2020-10-25 ENCOUNTER — Inpatient Hospital Stay (HOSPITAL_BASED_OUTPATIENT_CLINIC_OR_DEPARTMENT_OTHER): Payer: BC Managed Care – PPO | Admitting: Oncology

## 2020-10-25 VITALS — BP 128/86 | HR 88 | Temp 98.1°F | Resp 14 | Ht 70.0 in | Wt 175.0 lb

## 2020-10-25 DIAGNOSIS — Z923 Personal history of irradiation: Secondary | ICD-10-CM | POA: Insufficient documentation

## 2020-10-25 DIAGNOSIS — C61 Malignant neoplasm of prostate: Secondary | ICD-10-CM

## 2020-10-25 DIAGNOSIS — Z79899 Other long term (current) drug therapy: Secondary | ICD-10-CM | POA: Diagnosis not present

## 2020-10-25 DIAGNOSIS — C7951 Secondary malignant neoplasm of bone: Secondary | ICD-10-CM | POA: Insufficient documentation

## 2020-10-25 LAB — CBC WITH DIFFERENTIAL (CANCER CENTER ONLY)
Abs Immature Granulocytes: 0.03 10*3/uL (ref 0.00–0.07)
Basophils Absolute: 0 10*3/uL (ref 0.0–0.1)
Basophils Relative: 1 %
Eosinophils Absolute: 0.1 10*3/uL (ref 0.0–0.5)
Eosinophils Relative: 3 %
HCT: 35.8 % — ABNORMAL LOW (ref 39.0–52.0)
Hemoglobin: 12.4 g/dL — ABNORMAL LOW (ref 13.0–17.0)
Immature Granulocytes: 1 %
Lymphocytes Relative: 13 %
Lymphs Abs: 0.6 10*3/uL — ABNORMAL LOW (ref 0.7–4.0)
MCH: 32.2 pg (ref 26.0–34.0)
MCHC: 34.6 g/dL (ref 30.0–36.0)
MCV: 93 fL (ref 80.0–100.0)
Monocytes Absolute: 0.4 10*3/uL (ref 0.1–1.0)
Monocytes Relative: 9 %
Neutro Abs: 3.2 10*3/uL (ref 1.7–7.7)
Neutrophils Relative %: 73 %
Platelet Count: 250 10*3/uL (ref 150–400)
RBC: 3.85 MIL/uL — ABNORMAL LOW (ref 4.22–5.81)
RDW: 12.4 % (ref 11.5–15.5)
WBC Count: 4.4 10*3/uL (ref 4.0–10.5)
nRBC: 0 % (ref 0.0–0.2)

## 2020-10-25 LAB — CMP (CANCER CENTER ONLY)
ALT: 12 U/L (ref 0–44)
AST: 17 U/L (ref 15–41)
Albumin: 3.8 g/dL (ref 3.5–5.0)
Alkaline Phosphatase: 52 U/L (ref 38–126)
Anion gap: 7 (ref 5–15)
BUN: 8 mg/dL (ref 6–20)
CO2: 26 mmol/L (ref 22–32)
Calcium: 9 mg/dL (ref 8.9–10.3)
Chloride: 100 mmol/L (ref 98–111)
Creatinine: 0.83 mg/dL (ref 0.61–1.24)
GFR, Estimated: >60 mL/min (ref >60)
Glucose, Bld: 99 mg/dL (ref 70–99)
Potassium: 4.1 mmol/L (ref 3.5–5.1)
Sodium: 133 mmol/L — ABNORMAL LOW (ref 135–145)
Total Bilirubin: 0.4 mg/dL (ref 0.3–1.2)
Total Protein: 7.2 g/dL (ref 6.5–8.1)

## 2020-10-25 MED ORDER — LEUPROLIDE ACETATE (4 MONTH) 30 MG ~~LOC~~ KIT
PACK | SUBCUTANEOUS | Status: AC
  Filled 2020-10-25: qty 30

## 2020-10-25 MED ORDER — DENOSUMAB 120 MG/1.7ML ~~LOC~~ SOLN
SUBCUTANEOUS | Status: AC
  Filled 2020-10-25: qty 1.7

## 2020-10-25 MED ORDER — LEUPROLIDE ACETATE (4 MONTH) 30 MG ~~LOC~~ KIT
30.0000 mg | PACK | Freq: Once | SUBCUTANEOUS | Status: AC
  Administered 2020-10-25: 14:00:00 30 mg via SUBCUTANEOUS

## 2020-10-25 MED ORDER — DENOSUMAB 120 MG/1.7ML ~~LOC~~ SOLN
120.0000 mg | Freq: Once | SUBCUTANEOUS | Status: AC
  Administered 2020-10-25: 14:00:00 120 mg via SUBCUTANEOUS

## 2020-10-25 NOTE — Patient Instructions (Signed)
Denosumab injection What is this medicine? DENOSUMAB (den oh sue mab) slows bone breakdown. Prolia is used to treat osteoporosis in women after menopause and in men, and in people who are taking corticosteroids for 6 months or more. Xgeva is used to treat a high calcium level due to cancer and to prevent bone fractures and other bone problems caused by multiple myeloma or cancer bone metastases. Xgeva is also used to treat giant cell tumor of the bone. This medicine may be used for other purposes; ask your health care provider or pharmacist if you have questions. COMMON BRAND NAME(S): Prolia, XGEVA What should I tell my health care provider before I take this medicine? They need to know if you have any of these conditions:  dental disease  having surgery or tooth extraction  infection  kidney disease  low levels of calcium or Vitamin D in the blood  malnutrition  on hemodialysis  skin conditions or sensitivity  thyroid or parathyroid disease  an unusual reaction to denosumab, other medicines, foods, dyes, or preservatives  pregnant or trying to get pregnant  breast-feeding How should I use this medicine? This medicine is for injection under the skin. It is given by a health care professional in a hospital or clinic setting. A special MedGuide will be given to you before each treatment. Be sure to read this information carefully each time. For Prolia, talk to your pediatrician regarding the use of this medicine in children. Special care may be needed. For Xgeva, talk to your pediatrician regarding the use of this medicine in children. While this drug may be prescribed for children as young as 13 years for selected conditions, precautions do apply. Overdosage: If you think you have taken too much of this medicine contact a poison control center or emergency room at once. NOTE: This medicine is only for you. Do not share this medicine with others. What if I miss a dose? It is  important not to miss your dose. Call your doctor or health care professional if you are unable to keep an appointment. What may interact with this medicine? Do not take this medicine with any of the following medications:  other medicines containing denosumab This medicine may also interact with the following medications:  medicines that lower your chance of fighting infection  steroid medicines like prednisone or cortisone This list may not describe all possible interactions. Give your health care provider a list of all the medicines, herbs, non-prescription drugs, or dietary supplements you use. Also tell them if you smoke, drink alcohol, or use illegal drugs. Some items may interact with your medicine. What should I watch for while using this medicine? Visit your doctor or health care professional for regular checks on your progress. Your doctor or health care professional may order blood tests and other tests to see how you are doing. Call your doctor or health care professional for advice if you get a fever, chills or sore throat, or other symptoms of a cold or flu. Do not treat yourself. This drug may decrease your body's ability to fight infection. Try to avoid being around people who are sick. You should make sure you get enough calcium and vitamin D while you are taking this medicine, unless your doctor tells you not to. Discuss the foods you eat and the vitamins you take with your health care professional. See your dentist regularly. Brush and floss your teeth as directed. Before you have any dental work done, tell your dentist you are   receiving this medicine. Do not become pregnant while taking this medicine or for 5 months after stopping it. Talk with your doctor or health care professional about your birth control options while taking this medicine. Women should inform their doctor if they wish to become pregnant or think they might be pregnant. There is a potential for serious side  effects to an unborn child. Talk to your health care professional or pharmacist for more information. What side effects may I notice from receiving this medicine? Side effects that you should report to your doctor or health care professional as soon as possible:  allergic reactions like skin rash, itching or hives, swelling of the face, lips, or tongue  bone pain  breathing problems  dizziness  jaw pain, especially after dental work  redness, blistering, peeling of the skin  signs and symptoms of infection like fever or chills; cough; sore throat; pain or trouble passing urine  signs of low calcium like fast heartbeat, muscle cramps or muscle pain; pain, tingling, numbness in the hands or feet; seizures  unusual bleeding or bruising  unusually weak or tired Side effects that usually do not require medical attention (report to your doctor or health care professional if they continue or are bothersome):  constipation  diarrhea  headache  joint pain  loss of appetite  muscle pain  runny nose  tiredness  upset stomach This list may not describe all possible side effects. Call your doctor for medical advice about side effects. You may report side effects to FDA at 1-800-FDA-1088. Where should I keep my medicine? This medicine is only given in a clinic, doctor's office, or other health care setting and will not be stored at home. NOTE: This sheet is a summary. It may not cover all possible information. If you have questions about this medicine, talk to your doctor, pharmacist, or health care provider.  2020 Elsevier/Gold Standard (2018-03-07 16:10:44) Leuprolide depot injection What is this medicine? LEUPROLIDE (loo PROE lide) is a man-made protein that acts like a natural hormone in the body. It decreases testosterone in men and decreases estrogen in women. In men, this medicine is used to treat advanced prostate cancer. In women, some forms of this medicine may be used  to treat endometriosis, uterine fibroids, or other male hormone-related problems. This medicine may be used for other purposes; ask your health care provider or pharmacist if you have questions. COMMON BRAND NAME(S): Eligard, Fensolv, Lupron Depot, Lupron Depot-Ped, Viadur What should I tell my health care provider before I take this medicine? They need to know if you have any of these conditions:  diabetes  heart disease or previous heart attack  high blood pressure  high cholesterol  mental illness  osteoporosis  pain or difficulty passing urine  seizures  spinal cord metastasis  stroke  suicidal thoughts, plans, or attempt; a previous suicide attempt by you or a family member  tobacco smoker  unusual vaginal bleeding (women)  an unusual or allergic reaction to leuprolide, benzyl alcohol, other medicines, foods, dyes, or preservatives  pregnant or trying to get pregnant  breast-feeding How should I use this medicine? This medicine is for injection into a muscle or for injection under the skin. It is given by a health care professional in a hospital or clinic setting. The specific product will determine how it will be given to you. Make sure you understand which product you receive and how often you will receive it. Talk to your pediatrician regarding the use of   this medicine in children. Special care may be needed. Overdosage: If you think you have taken too much of this medicine contact a poison control center or emergency room at once. NOTE: This medicine is only for you. Do not share this medicine with others. What if I miss a dose? It is important not to miss a dose. Call your doctor or health care professional if you are unable to keep an appointment. Depot injections: Depot injections are given either once-monthly, every 12 weeks, every 16 weeks, or every 24 weeks depending on the product you are prescribed. The product you are prescribed will be based on if you  are male or male, and your condition. Make sure you understand your product and dosing. What may interact with this medicine? Do not take this medicine with any of the following medications:  chasteberry This medicine may also interact with the following medications:  herbal or dietary supplements, like black cohosh or DHEA  male hormones, like estrogens or progestins and birth control pills, patches, rings, or injections  male hormones, like testosterone This list may not describe all possible interactions. Give your health care provider a list of all the medicines, herbs, non-prescription drugs, or dietary supplements you use. Also tell them if you smoke, drink alcohol, or use illegal drugs. Some items may interact with your medicine. What should I watch for while using this medicine? Visit your doctor or health care professional for regular checks on your progress. During the first weeks of treatment, your symptoms may get worse, but then will improve as you continue your treatment. You may get hot flashes, increased bone pain, increased difficulty passing urine, or an aggravation of nerve symptoms. Discuss these effects with your doctor or health care professional, some of them may improve with continued use of this medicine. Male patients may experience a menstrual cycle or spotting during the first months of therapy with this medicine. If this continues, contact your doctor or health care professional. This medicine may increase blood sugar. Ask your healthcare provider if changes in diet or medicines are needed if you have diabetes. What side effects may I notice from receiving this medicine? Side effects that you should report to your doctor or health care professional as soon as possible:  allergic reactions like skin rash, itching or hives, swelling of the face, lips, or tongue  breathing problems  chest pain  depression or memory disorders  pain in your legs or  groin  pain at site where injected or implanted  seizures  severe headache  signs and symptoms of high blood sugar such as being more thirsty or hungry or having to urinate more than normal. You may also feel very tired or have blurry vision  swelling of the feet and legs  suicidal thoughts or other mood changes  visual changes  vomiting Side effects that usually do not require medical attention (report to your doctor or health care professional if they continue or are bothersome):  breast swelling or tenderness  decrease in sex drive or performance  diarrhea  hot flashes  loss of appetite  muscle, joint, or bone pains  nausea  redness or irritation at site where injected or implanted  skin problems or acne This list may not describe all possible side effects. Call your doctor for medical advice about side effects. You may report side effects to FDA at 1-800-FDA-1088. Where should I keep my medicine? This drug is given in a hospital or clinic and will not be  stored at home. NOTE: This sheet is a summary. It may not cover all possible information. If you have questions about this medicine, talk to your doctor, pharmacist, or health care provider.  2020 Elsevier/Gold Standard (2018-08-28 09:27:03)

## 2020-10-25 NOTE — Progress Notes (Signed)
Hematology and Oncology Follow Up Visit  Dan Farrell [id number] [birth year] 59 y.o. 10/25/2020 12:35 PM Dan Farrell, Dan Farrell, MDEhinger, Dan [name], MD   Principle Diagnosis: 59 year old man with advanced prostate cancer diagnosed in 2019.  He has castration-sensitive disease with bone involvement.  There is PSA of 11 and Gleason score 4+4 = 8 at the time of diagnosis.   Prior Therapy:  Prostate biopsy obtained in May 2019.  Radiation therapy to the prostate completed and November 2019.  He received 45 Gy in 25 fractions followed by 30 Gy in 15 fractions  Current therapy:  Eligard 30 mg every 4 months.  Next injection will be given today and repeated in 4 months.  Zytiga 1000 mg with prednisone 5 mg started in July 2019  Xgeva 120 mg every 8 weeks.  He will receive injection today and repeated in 2 months.  Interim History: Dan Farrell returns today for repeat evaluation.  Since the last visit, he reports no major changes in his health.  He continues to have chronic hip pain which has been exacerbated recently and scheduled to have hip replacement next week.  He denies any bone pain or pathological fractures.  He denies any worsening fatigue or complications related to Zytiga.  His performance status and quality of life overall maintained.  He continues to work full-time.     Medications: Unchanged on review. Current Outpatient Medications  Medication Sig Dispense Refill  . abiraterone acetate (ZYTIGA) 250 MG tablet TAKE 4 TABLETS BY MOUTH 1 TIME A DAY ON AN EMPTY STOMACH 1 HOUR BEFORE OR 2 HOURS AFTER A MEAL. 120 tablet 0  . celecoxib (CELEBREX) 50 MG capsule Take 1 capsule (50 mg total) by mouth daily. 4 capsule 0  . fenofibrate 160 MG tablet TAKE 1 TABLET BY MOUTH ONCE A DAY FOR CHOLESTEROL  3  . fluconazole (DIFLUCAN) 100 MG tablet Take 1 tablet (100 mg total) by mouth daily. 5 tablet 0  . Multiple Vitamin (MULTIVITAMIN) tablet Take 1 tablet by mouth daily.    . naproxen sodium  (ANAPROX) 220 MG tablet Take 220 mg by mouth 2 (two) times daily with a meal.    . OS-CAL CALCIUM + D3 500-200 MG-UNIT TABS TAKE 1 TABLET BY MOUTH TWICE A DAY 90 tablet 3  . predniSONE (DELTASONE) 5 MG tablet TAKE 1 TABLET BY MOUTH EVERY DAY WITH BREAKFAST 90 tablet 3  . SUMAtriptan Succinate (ZEMBRACE SYMTOUCH) 3 MG/0.5ML SOAJ Inject 3 mg into the skin as directed. At earliest onset, may repeat in 2 hours if headache persists or reoccurs (Patient not taking: Reported on 10/22/2018) 4.5 mL 3  . valsartan-hydrochlorothiazide (DIOVAN-HCT) 160-12.5 MG tablet Take 1 tablet by mouth daily.  0   No current facility-administered medications for this visit.     Allergies: No Known Allergies     Physical Exam:      Blood pressure 128/86, pulse 88, temperature 98.1 F (36.7 C), temperature source Tympanic, resp. rate 14, height 5\' 10"  (1.778 m), weight 175 lb (79.4 kg), SpO2 99 %.       ECOG: 0   General appearance: Comfortable appearing without any discomfort Head: Normocephalic without any trauma Oropharynx: Mucous membranes are moist and pink without any thrush or ulcers. Eyes: Pupils are equal and round reactive to light. Lymph nodes: No cervical, supraclavicular, inguinal or axillary lymphadenopathy.   Heart:regular rate and rhythm.  S1 and S2 without leg edema. Lung: Clear without any rhonchi or wheezes.  No dullness to percussion. Abdomin: Soft, nontender,  nondistended with good bowel sounds.  No hepatosplenomegaly. Musculoskeletal: No joint deformity or effusion.  Full range of motion noted. Neurological: No deficits noted on motor, sensory and deep tendon reflex exam. Skin: No petechial rash or dryness.  Appeared moist.         CBC    Component Value Date/Time   WBC 5.6 08/16/2020 1436   RBC 3.75 (L) 08/16/2020 1436   HGB 12.0 (L) 08/16/2020 1436   HCT 34.8 (L) 08/16/2020 1436   PLT 229 08/16/2020 1436   MCV 92.8 08/16/2020 1436   MCH 32.0 08/16/2020 1436    MCHC 34.5 08/16/2020 1436   RDW 12.6 08/16/2020 1436   LYMPHSABS 0.6 (L) 08/16/2020 1436   MONOABS 0.4 08/16/2020 1436   EOSABS 0.1 08/16/2020 1436   BASOSABS 0.0 08/16/2020 1436            Results for Dan Farrell (MRN [id number]) as of 10/25/2020 12:37  Ref. Range 04/14/2020 14:35 06/14/2020 14:10 08/16/2020 14:36  Prostate Specific Ag, Serum Latest Ref Range: 0.0 - 4.0 ng/mL <0.1 <0.1 <0.1        Impression and Plan:    59 year old man with:  1.   Castration-sensitive prostate cancer was disease to the bone diagnosed in 2019.   He continues to tolerate Zytiga without any major complaints.  His PSA currently undetectable and continues to have excellent response to therapy.  Risks and benefits of continuing this therapy long-term were reviewed.  Alternative treatment options including systemic chemotherapy among others were reiterated.  He is agreeable to continue at this time.  2.  Androgen deprivation: He will receive Eligard today and repeated in 4 months.  Long-term complication associated with this therapy including hot flashes, weight gain among others.  He is agreeable to proceed   3.  Bone directed therapy: He is agreeable to continue on Xgeva at this time.  Potential complications including osteonecrosis of the jaw and hypocalcemia were reiterated.  4.  Liver function and electrolyte surveillance: No abnormalities detected at this time.  These will be monitored on Zytiga.  5.  Hypertension: No issues reported with his blood pressure which we will continue to monitor.  6.  Hip pain: He is scheduled for hip surgery in the near future.  No objections at this time recommended continuing medication without any dose reduction or delay.  7.  Prognosis: Therapy remains palliative although aggressive measures are warranted given his excellent performance status.   8.  Follow-up: In 2 months for repeat follow-up.   30 minutes were dedicated to this visit.  The  time was spent on reviewing disease status, discussing treatment options and outlining future plan of care.    Zola Button, MD 12/14/202112:35 PM

## 2020-10-26 ENCOUNTER — Telehealth: Payer: Self-pay

## 2020-10-26 LAB — PROSTATE-SPECIFIC AG, SERUM (LABCORP): Prostate Specific Ag, Serum: <0.1 ng/mL (ref 0.0–4.0)

## 2020-10-26 NOTE — Telephone Encounter (Signed)
-----   Message from Wyatt Portela, MD sent at 10/26/2020  8:29 AM EST ----- Please let him know his PSA still low

## 2020-10-26 NOTE — Telephone Encounter (Signed)
Called patient and let him know PSA result. He verbalized understanding.  °

## 2020-12-05 ENCOUNTER — Other Ambulatory Visit: Payer: Self-pay | Admitting: Oncology

## 2020-12-05 DIAGNOSIS — C61 Malignant neoplasm of prostate: Secondary | ICD-10-CM

## 2020-12-19 ENCOUNTER — Other Ambulatory Visit: Payer: Self-pay | Admitting: Oncology

## 2020-12-19 DIAGNOSIS — C61 Malignant neoplasm of prostate: Secondary | ICD-10-CM

## 2020-12-20 ENCOUNTER — Inpatient Hospital Stay: Payer: Self-pay

## 2020-12-20 ENCOUNTER — Inpatient Hospital Stay: Payer: BC Managed Care – PPO | Attending: Oncology | Admitting: Oncology

## 2020-12-20 ENCOUNTER — Inpatient Hospital Stay: Payer: Self-pay | Admitting: Oncology

## 2020-12-20 ENCOUNTER — Ambulatory Visit: Payer: BC Managed Care – PPO

## 2020-12-20 ENCOUNTER — Inpatient Hospital Stay: Payer: BC Managed Care – PPO | Attending: Hematology and Oncology

## 2020-12-20 ENCOUNTER — Other Ambulatory Visit: Payer: Self-pay

## 2020-12-20 VITALS — BP 142/86 | HR 88 | Temp 97.9°F | Resp 18 | Ht 70.0 in | Wt 172.9 lb

## 2020-12-20 DIAGNOSIS — Z79899 Other long term (current) drug therapy: Secondary | ICD-10-CM | POA: Insufficient documentation

## 2020-12-20 DIAGNOSIS — I10 Essential (primary) hypertension: Secondary | ICD-10-CM | POA: Diagnosis not present

## 2020-12-20 DIAGNOSIS — Z923 Personal history of irradiation: Secondary | ICD-10-CM | POA: Diagnosis not present

## 2020-12-20 DIAGNOSIS — C61 Malignant neoplasm of prostate: Secondary | ICD-10-CM | POA: Diagnosis present

## 2020-12-20 DIAGNOSIS — Z96642 Presence of left artificial hip joint: Secondary | ICD-10-CM | POA: Diagnosis not present

## 2020-12-20 DIAGNOSIS — C7951 Secondary malignant neoplasm of bone: Secondary | ICD-10-CM | POA: Diagnosis not present

## 2020-12-20 LAB — CBC WITH DIFFERENTIAL (CANCER CENTER ONLY)
Abs Immature Granulocytes: 0.01 10*3/uL (ref 0.00–0.07)
Basophils Absolute: 0 10*3/uL (ref 0.0–0.1)
Basophils Relative: 1 %
Eosinophils Absolute: 0.1 10*3/uL (ref 0.0–0.5)
Eosinophils Relative: 2 %
HCT: 35.5 % — ABNORMAL LOW (ref 39.0–52.0)
Hemoglobin: 12 g/dL — ABNORMAL LOW (ref 13.0–17.0)
Immature Granulocytes: 0 %
Lymphocytes Relative: 16 %
Lymphs Abs: 0.7 10*3/uL (ref 0.7–4.0)
MCH: 31.3 pg (ref 26.0–34.0)
MCHC: 33.8 g/dL (ref 30.0–36.0)
MCV: 92.7 fL (ref 80.0–100.0)
Monocytes Absolute: 0.4 10*3/uL (ref 0.1–1.0)
Monocytes Relative: 8 %
Neutro Abs: 3.2 10*3/uL (ref 1.7–7.7)
Neutrophils Relative %: 73 %
Platelet Count: 251 10*3/uL (ref 150–400)
RBC: 3.83 MIL/uL — ABNORMAL LOW (ref 4.22–5.81)
RDW: 13.3 % (ref 11.5–15.5)
WBC Count: 4.4 10*3/uL (ref 4.0–10.5)
nRBC: 0 % (ref 0.0–0.2)

## 2020-12-20 LAB — CMP (CANCER CENTER ONLY)
ALT: 12 U/L (ref 0–44)
AST: 15 U/L (ref 15–41)
Albumin: 4.1 g/dL (ref 3.5–5.0)
Alkaline Phosphatase: 41 U/L (ref 38–126)
Anion gap: 7 (ref 5–15)
BUN: 11 mg/dL (ref 6–20)
CO2: 27 mmol/L (ref 22–32)
Calcium: 9.6 mg/dL (ref 8.9–10.3)
Chloride: 103 mmol/L (ref 98–111)
Creatinine: 0.93 mg/dL (ref 0.61–1.24)
GFR, Estimated: >60 mL/min (ref >60)
Glucose, Bld: 96 mg/dL (ref 70–99)
Potassium: 4.2 mmol/L (ref 3.5–5.1)
Sodium: 137 mmol/L (ref 135–145)
Total Bilirubin: 0.5 mg/dL (ref 0.3–1.2)
Total Protein: 7.3 g/dL (ref 6.5–8.1)

## 2020-12-20 MED ORDER — DENOSUMAB 120 MG/1.7ML ~~LOC~~ SOLN
SUBCUTANEOUS | Status: AC
  Filled 2020-12-20: qty 1.7

## 2020-12-20 MED ORDER — DENOSUMAB 120 MG/1.7ML ~~LOC~~ SOLN
120.0000 mg | Freq: Once | SUBCUTANEOUS | Status: AC
  Administered 2020-12-20: 120 mg via SUBCUTANEOUS

## 2020-12-20 NOTE — Progress Notes (Signed)
Hematology and Oncology Follow Up Visit  Dan Farrell [id number] May 27, 1961 60 y.o. 12/20/2020 10:00 AM Dan Farrell, MDEhinger, Dan [name], MD   Principle Diagnosis: 60 year old man with castration-sensitive advanced prostate cancer with disease to the bone diagnosed in 2019.  He presented with PSA of 11 and Gleason score 4+4 = 8.   Prior Therapy:  Prostate biopsy obtained in May 2019.  Radiation therapy to the prostate completed and November 2019.  He received 45 Gy in 25 fractions followed by 30 Gy in 15 fractions  Current therapy:  Eligard 30 mg every 4 months.  Next injection will be given in April 2022.  Zytiga 1000 mg with prednisone 5 mg started in July 2019  Xgeva 120 mg every 8 weeks.    Interim History: Dan Farrell is here for a follow-up visit.  Since the last visit, he reports feeling well without any major complaints.  He underwent hip surgery in December 4709 without complications.  He had a left hip replacement without any complications.  He is able to ambulate without any major difficulties.  He denies any recent falls or syncope.  He continues to tolerate Zytiga without any complaints.  Denies any fatigue, hot flashes or any hospitalizations.     Medications: Reviewed without changes. Current Outpatient Medications  Medication Sig Dispense Refill  . abiraterone acetate (ZYTIGA) 250 MG tablet TAKE 4 TABLETS BY MOUTH 1 TIME A DAY ON AN EMPTY STOMACH 1 HOUR BEFORE OR 2 HOURS AFTER A MEAL. 120 tablet 0  . celecoxib (CELEBREX) 50 MG capsule Take 1 capsule (50 mg total) by mouth daily. 4 capsule 0  . fenofibrate 160 MG tablet TAKE 1 TABLET BY MOUTH ONCE A DAY FOR CHOLESTEROL  3  . fluconazole (DIFLUCAN) 100 MG tablet Take 1 tablet (100 mg total) by mouth daily. 5 tablet 0  . Multiple Vitamin (MULTIVITAMIN) tablet Take 1 tablet by mouth daily.    . naproxen sodium (ANAPROX) 220 MG tablet Take 220 mg by mouth 2 (two) times daily with a meal.    . OS-CAL CALCIUM + D3  500-200 MG-UNIT TABS TAKE 1 TABLET BY MOUTH TWICE A DAY 90 tablet 3  . predniSONE (DELTASONE) 5 MG tablet TAKE 1 TABLET BY MOUTH EVERY DAY WITH BREAKFAST 90 tablet 3  . SUMAtriptan Succinate (ZEMBRACE SYMTOUCH) 3 MG/0.5ML SOAJ Inject 3 mg into the skin as directed. At earliest onset, may repeat in 2 hours if headache persists or reoccurs (Patient not taking: Reported on 10/22/2018) 4.5 mL 3  . valsartan-hydrochlorothiazide (DIOVAN-HCT) 160-12.5 MG tablet Take 1 tablet by mouth daily.  0   No current facility-administered medications for this visit.     Allergies: No Known Allergies     Physical Exam:     Blood pressure (!) 142/86, pulse 88, temperature 97.9 F (36.6 C), temperature source Tympanic, resp. rate 18, height 5\' 10"  (1.778 m), weight 172 lb 14.4 oz (78.4 kg), SpO2 99 %.         ECOG: 0    General appearance: Alert, awake without any distress. Head: Atraumatic without abnormalities Oropharynx: Without any thrush or ulcers. Eyes: No scleral icterus. Lymph nodes: No lymphadenopathy noted in the cervical, supraclavicular, or axillary nodes Heart:regular rate and rhythm, without any murmurs or gallops.   Lung: Clear to auscultation without any rhonchi, wheezes or dullness to percussion. Abdomin: Soft, nontender without any shifting dullness or ascites. Musculoskeletal: No clubbing or cyanosis. Neurological: No motor or sensory deficits. Skin: No rashes or lesions.  CBC    Component Value Date/Time   WBC 4.4 10/25/2020 1240   RBC 3.85 (L) 10/25/2020 1240   HGB 12.4 (L) 10/25/2020 1240   HCT 35.8 (L) 10/25/2020 1240   PLT 250 10/25/2020 1240   MCV 93.0 10/25/2020 1240   MCH 32.2 10/25/2020 1240   MCHC 34.6 10/25/2020 1240   RDW 12.4 10/25/2020 1240   LYMPHSABS 0.6 (L) 10/25/2020 1240   MONOABS 0.4 10/25/2020 1240   EOSABS 0.1 10/25/2020 1240   BASOSABS 0.0 10/25/2020 1240         Results for Dan Farrell (MRN [id number]) as  of 12/20/2020 10:02  Ref. Range 10/25/2020 12:40  Prostate Specific Ag, Serum Latest Ref Range: 0.0 - 4.0 ng/mL <0.1            Impression and Plan:    60 year old man with:  1.    Advanced Prostate cancer disease to the bone diagnosed in 2019.  He has castration-sensitive disease currently.   The natural course of this disease was updated at this time and treatment options were reviewed.  He is currently on Zytiga without any major complications.  Imaging studies in May 2020 did not show any disease relapse.  Risks and benefits of continuing this treatment were discussed today.  The role of alternative options such as systemic chemotherapy among others were reviewed.  Updating imaging scans including CT scan and bone scan were reviewed at this time.   Laboratory data from today reviewed and his PSA in the last 2 years was also updated and remains undetectable.  He is agreeable to continue at this time.  2.  Androgen deprivation: Next Eligard will be given in April 2022.  Complications including weight gain, hot flashes among others were reviewed.  We discussed the role of orchiectomy as he is interested in eliminating any additional injections at this time.  He will consider that option in the near future.  3.  Bone directed therapy: He continues to be on Xgeva and repeated every 2 months.  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  He is agreeable to proceed and the plan is to switch him to every 4 months after the next injection.  4.  Liver function and electrolyte surveillance: No issues reported and will continue to be checked periodically.  5.  Hypertension: Blood pressure close to normal range at this time we will continue to monitor on Zytiga.  6.  Hip pain: Improved after hip surgery and replacement.  7.  Prognosis: His disease is incurable although aggressive measures are warranted given his excellent performance status.  8.  Follow-up: He  will return in 2 months for repeat follow-up.   30 minutes were spent on this encounter.  Time was dedicated to reviewing laboratory data, disease status update, discussing treatment choices and future plan of care review.    Dan Button, MD 2/8/202210:00 AM

## 2020-12-21 ENCOUNTER — Telehealth: Payer: Self-pay

## 2020-12-21 LAB — PROSTATE-SPECIFIC AG, SERUM (LABCORP): Prostate Specific Ag, Serum: <0.1 ng/mL (ref 0.0–4.0)

## 2020-12-21 NOTE — Telephone Encounter (Signed)
Called patient and let him know his PSA result. Patient verbalized understanding.

## 2020-12-21 NOTE — Telephone Encounter (Signed)
-----   Message from Wyatt Portela, MD sent at 12/21/2020  9:38 AM EST ----- Please let him know his PSA is down

## 2021-02-06 ENCOUNTER — Other Ambulatory Visit: Payer: Self-pay | Admitting: Oncology

## 2021-02-06 DIAGNOSIS — C61 Malignant neoplasm of prostate: Secondary | ICD-10-CM

## 2021-02-17 ENCOUNTER — Telehealth: Payer: Self-pay | Admitting: Oncology

## 2021-02-17 NOTE — Telephone Encounter (Signed)
Called and spoke with patient per sch msg. R/s 4/12 appt per patient request. Confirmed new date and time

## 2021-02-21 ENCOUNTER — Inpatient Hospital Stay: Payer: BC Managed Care – PPO

## 2021-02-21 ENCOUNTER — Inpatient Hospital Stay: Payer: BC Managed Care – PPO | Admitting: Oncology

## 2021-02-23 ENCOUNTER — Inpatient Hospital Stay: Payer: BC Managed Care – PPO | Attending: Oncology

## 2021-02-23 ENCOUNTER — Inpatient Hospital Stay (HOSPITAL_BASED_OUTPATIENT_CLINIC_OR_DEPARTMENT_OTHER): Payer: BC Managed Care – PPO | Admitting: Oncology

## 2021-02-23 ENCOUNTER — Other Ambulatory Visit: Payer: Self-pay

## 2021-02-23 ENCOUNTER — Inpatient Hospital Stay: Payer: BC Managed Care – PPO

## 2021-02-23 VITALS — BP 132/75 | HR 82 | Temp 96.3°F | Resp 17 | Ht 70.0 in | Wt 175.2 lb

## 2021-02-23 DIAGNOSIS — C61 Malignant neoplasm of prostate: Secondary | ICD-10-CM

## 2021-02-23 DIAGNOSIS — C7951 Secondary malignant neoplasm of bone: Secondary | ICD-10-CM | POA: Diagnosis not present

## 2021-02-23 DIAGNOSIS — Z923 Personal history of irradiation: Secondary | ICD-10-CM | POA: Insufficient documentation

## 2021-02-23 DIAGNOSIS — Z79899 Other long term (current) drug therapy: Secondary | ICD-10-CM | POA: Diagnosis not present

## 2021-02-23 LAB — CBC WITH DIFFERENTIAL (CANCER CENTER ONLY)
Abs Immature Granulocytes: 0.02 10*3/uL (ref 0.00–0.07)
Basophils Absolute: 0 10*3/uL (ref 0.0–0.1)
Basophils Relative: 0 %
Eosinophils Absolute: 0.1 10*3/uL (ref 0.0–0.5)
Eosinophils Relative: 2 %
HCT: 33.2 % — ABNORMAL LOW (ref 39.0–52.0)
Hemoglobin: 11.5 g/dL — ABNORMAL LOW (ref 13.0–17.0)
Immature Granulocytes: 0 %
Lymphocytes Relative: 13 %
Lymphs Abs: 0.6 10*3/uL — ABNORMAL LOW (ref 0.7–4.0)
MCH: 31.7 pg (ref 26.0–34.0)
MCHC: 34.6 g/dL (ref 30.0–36.0)
MCV: 91.5 fL (ref 80.0–100.0)
Monocytes Absolute: 0.4 10*3/uL (ref 0.1–1.0)
Monocytes Relative: 8 %
Neutro Abs: 3.8 10*3/uL (ref 1.7–7.7)
Neutrophils Relative %: 77 %
Platelet Count: 248 10*3/uL (ref 150–400)
RBC: 3.63 MIL/uL — ABNORMAL LOW (ref 4.22–5.81)
RDW: 13.8 % (ref 11.5–15.5)
WBC Count: 5 10*3/uL (ref 4.0–10.5)
nRBC: 0 % (ref 0.0–0.2)

## 2021-02-23 LAB — CMP (CANCER CENTER ONLY)
ALT: 10 U/L (ref 0–44)
AST: 15 U/L (ref 15–41)
Albumin: 3.9 g/dL (ref 3.5–5.0)
Alkaline Phosphatase: 53 U/L (ref 38–126)
Anion gap: 12 (ref 5–15)
BUN: 19 mg/dL (ref 6–20)
CO2: 23 mmol/L (ref 22–32)
Calcium: 9.3 mg/dL (ref 8.9–10.3)
Chloride: 104 mmol/L (ref 98–111)
Creatinine: 1 mg/dL (ref 0.61–1.24)
GFR, Estimated: >60 mL/min (ref >60)
Glucose, Bld: 117 mg/dL — ABNORMAL HIGH (ref 70–99)
Potassium: 4.1 mmol/L (ref 3.5–5.1)
Sodium: 139 mmol/L (ref 135–145)
Total Bilirubin: 0.3 mg/dL (ref 0.3–1.2)
Total Protein: 7.1 g/dL (ref 6.5–8.1)

## 2021-02-23 MED ORDER — DENOSUMAB 120 MG/1.7ML ~~LOC~~ SOLN
120.0000 mg | Freq: Once | SUBCUTANEOUS | Status: AC
Start: 2021-02-23 — End: 2021-02-23
  Administered 2021-02-23: 120 mg via SUBCUTANEOUS

## 2021-02-23 MED ORDER — LEUPROLIDE ACETATE (4 MONTH) 30 MG ~~LOC~~ KIT
30.0000 mg | PACK | Freq: Once | SUBCUTANEOUS | Status: AC
  Administered 2021-02-23: 30 mg via SUBCUTANEOUS

## 2021-02-23 MED ORDER — DENOSUMAB 120 MG/1.7ML ~~LOC~~ SOLN
SUBCUTANEOUS | Status: AC
  Filled 2021-02-23: qty 1.7

## 2021-02-23 MED ORDER — LEUPROLIDE ACETATE (4 MONTH) 30 MG ~~LOC~~ KIT
PACK | SUBCUTANEOUS | Status: AC
  Filled 2021-02-23: qty 30

## 2021-02-23 NOTE — Patient Instructions (Signed)
Denosumab injection What is this medicine? DENOSUMAB (den oh sue mab) slows bone breakdown. Prolia is used to treat osteoporosis in women after menopause and in men, and in people who are taking corticosteroids for 6 months or more. Xgeva is used to treat a high calcium level due to cancer and to prevent bone fractures and other bone problems caused by multiple myeloma or cancer bone metastases. Xgeva is also used to treat giant cell tumor of the bone. This medicine may be used for other purposes; ask your health care provider or pharmacist if you have questions. COMMON BRAND NAME(S): Prolia, XGEVA What should I tell my health care provider before I take this medicine? They need to know if you have any of these conditions:  dental disease  having surgery or tooth extraction  infection  kidney disease  low levels of calcium or Vitamin D in the blood  malnutrition  on hemodialysis  skin conditions or sensitivity  thyroid or parathyroid disease  an unusual reaction to denosumab, other medicines, foods, dyes, or preservatives  pregnant or trying to get pregnant  breast-feeding How should I use this medicine? This medicine is for injection under the skin. It is given by a health care professional in a hospital or clinic setting. A special MedGuide will be given to you before each treatment. Be sure to read this information carefully each time. For Prolia, talk to your pediatrician regarding the use of this medicine in children. Special care may be needed. For Xgeva, talk to your pediatrician regarding the use of this medicine in children. While this drug may be prescribed for children as young as 13 years for selected conditions, precautions do apply. Overdosage: If you think you have taken too much of this medicine contact a poison control center or emergency room at once. NOTE: This medicine is only for you. Do not share this medicine with others. What if I miss a dose? It is  important not to miss your dose. Call your doctor or health care professional if you are unable to keep an appointment. What may interact with this medicine? Do not take this medicine with any of the following medications:  other medicines containing denosumab This medicine may also interact with the following medications:  medicines that lower your chance of fighting infection  steroid medicines like prednisone or cortisone This list may not describe all possible interactions. Give your health care provider a list of all the medicines, herbs, non-prescription drugs, or dietary supplements you use. Also tell them if you smoke, drink alcohol, or use illegal drugs. Some items may interact with your medicine. What should I watch for while using this medicine? Visit your doctor or health care professional for regular checks on your progress. Your doctor or health care professional may order blood tests and other tests to see how you are doing. Call your doctor or health care professional for advice if you get a fever, chills or sore throat, or other symptoms of a cold or flu. Do not treat yourself. This drug may decrease your body's ability to fight infection. Try to avoid being around people who are sick. You should make sure you get enough calcium and vitamin D while you are taking this medicine, unless your doctor tells you not to. Discuss the foods you eat and the vitamins you take with your health care professional. See your dentist regularly. Brush and floss your teeth as directed. Before you have any dental work done, tell your dentist you are   receiving this medicine. Do not become pregnant while taking this medicine or for 5 months after stopping it. Talk with your doctor or health care professional about your birth control options while taking this medicine. Women should inform their doctor if they wish to become pregnant or think they might be pregnant. There is a potential for serious side  effects to an unborn child. Talk to your health care professional or pharmacist for more information. What side effects may I notice from receiving this medicine? Side effects that you should report to your doctor or health care professional as soon as possible:  allergic reactions like skin rash, itching or hives, swelling of the face, lips, or tongue  bone pain  breathing problems  dizziness  jaw pain, especially after dental work  redness, blistering, peeling of the skin  signs and symptoms of infection like fever or chills; cough; sore throat; pain or trouble passing urine  signs of low calcium like fast heartbeat, muscle cramps or muscle pain; pain, tingling, numbness in the hands or feet; seizures  unusual bleeding or bruising  unusually weak or tired Side effects that usually do not require medical attention (report to your doctor or health care professional if they continue or are bothersome):  constipation  diarrhea  headache  joint pain  loss of appetite  muscle pain  runny nose  tiredness  upset stomach This list may not describe all possible side effects. Call your doctor for medical advice about side effects. You may report side effects to FDA at 1-800-FDA-1088. Where should I keep my medicine? This medicine is only given in a clinic, doctor's office, or other health care setting and will not be stored at home. NOTE: This sheet is a summary. It may not cover all possible information. If you have questions about this medicine, talk to your doctor, pharmacist, or health care provider.  2021 Elsevier/Gold Standard (2018-03-07 16:10:44) Leuprolide injection What is this medicine? LEUPROLIDE (loo PROE lide) is a man-made hormone. It is used to treat the symptoms of prostate cancer. This medicine may also be used to treat children with early onset of puberty. It may be used for other hormonal conditions. This medicine may be used for other purposes; ask your  health care provider or pharmacist if you have questions. COMMON BRAND NAME(S): Lupron What should I tell my health care provider before I take this medicine? They need to know if you have any of these conditions:  diabetes  heart disease or previous heart attack  high blood pressure  high cholesterol  pain or difficulty passing urine  spinal cord metastasis  stroke  tobacco smoker  an unusual or allergic reaction to leuprolide, benzyl alcohol, other medicines, foods, dyes, or preservatives  pregnant or trying to get pregnant  breast-feeding How should I use this medicine? This medicine is for injection under the skin or into a muscle. You will be taught how to prepare and give this medicine. Use exactly as directed. Take your medicine at regular intervals. Do not take your medicine more often than directed. It is important that you put your used needles and syringes in a special sharps container. Do not put them in a trash can. If you do not have a sharps container, call your pharmacist or healthcare provider to get one. A special MedGuide will be given to you by the pharmacist with each prescription and refill. Be sure to read this information carefully each time. Talk to your pediatrician regarding the use of   this medicine in children. While this medicine may be prescribed for children as young as 8 years for selected conditions, precautions do apply. Overdosage: If you think you have taken too much of this medicine contact a poison control center or emergency room at once. NOTE: This medicine is only for you. Do not share this medicine with others. What if I miss a dose? If you miss a dose, take it as soon as you can. If it is almost time for your next dose, take only that dose. Do not take double or extra doses. What may interact with this medicine? Do not take this medicine with any of the following  medications:  chasteberry  cisapride  dronedarone  pimozide  thioridazine This medicine may also interact with the following medications:  herbal or dietary supplements, like black cohosh or DHEA  male hormones, like estrogens or progestins and birth control pills, patches, rings, or injections  male hormones, like testosterone  other medicines that prolong the QT interval (abnormal heart rhythm) This list may not describe all possible interactions. Give your health care provider a list of all the medicines, herbs, non-prescription drugs, or dietary supplements you use. Also tell them if you smoke, drink alcohol, or use illegal drugs. Some items may interact with your medicine. What should I watch for while using this medicine? Visit your doctor or health care professional for regular checks on your progress. During the first week, your symptoms may get worse, but then will improve as you continue your treatment. You may get hot flashes, increased bone pain, increased difficulty passing urine, or an aggravation of nerve symptoms. Discuss these effects with your doctor or health care professional, some of them may improve with continued use of this medicine. Male patients may experience a menstrual cycle or spotting during the first 2 months of therapy with this medicine. If this continues, contact your doctor or health care professional. This medicine may increase blood sugar. Ask your healthcare provider if changes in diet or medicines are needed if you have diabetes. What side effects may I notice from receiving this medicine? Side effects that you should report to your doctor or health care professional as soon as possible:  allergic reactions like skin rash, itching or hives, swelling of the face, lips, or tongue  breathing problems  chest pain  depression or memory disorders  pain in your legs or groin  pain at site where injected  severe headache  signs and  symptoms of high blood sugar such as being more thirsty or hungry or having to urinate more than normal. You may also feel very tired or have blurry vision  swelling of the feet and legs  visual changes  vomiting Side effects that usually do not require medical attention (report to your doctor or health care professional if they continue or are bothersome):  breast swelling or tenderness  decrease in sex drive or performance  diarrhea  hot flashes  loss of appetite  muscle, joint, or bone pains  nausea  redness or irritation at site where injected  skin problems or acne This list may not describe all possible side effects. Call your doctor for medical advice about side effects. You may report side effects to FDA at 1-800-FDA-1088. Where should I keep my medicine? Keep out of the reach of children. Store below 25 degrees C (77 degrees F). Do not freeze. Protect from light. Do not use if it is not clear or if there are particles present. Throw  away any unused medicine after the expiration date. NOTE: This sheet is a summary. It may not cover all possible information. If you have questions about this medicine, talk to your doctor, pharmacist, or health care provider.  2021 Elsevier/Gold Standard (2019-09-30 10:57:41)  

## 2021-02-23 NOTE — Progress Notes (Signed)
Hematology and Oncology Follow Up Visit  Dan Farrell [id number] Apr 24, 1961 60 y.o. 02/23/2021 9:09 AM Dan Farrell, Dan Farrell, MDEhinger, Dan [name], MD   Principle Diagnosis: 60 year old man with advanced prostate cancer with disease to the bone diagnosed in 2019.  He has castration-sensitive after presenting with PSA of 11 and Gleason score 4+4 = 8 at the time of diagnosis.   Prior Therapy:  Prostate biopsy obtained in May 2019.  Radiation therapy to the prostate completed and November 2019.  He received 45 Gy in 25 fractions followed by 30 Gy in 15 fractions  Current therapy:  Eligard 30 mg every 4 months.  He will receive this today and repeated in 4 months.  Zytiga 1000 mg with prednisone 5 mg started in July 2019  Xgeva 120 mg every 8 weeks.  This will be changed to every 4 months for convenience.  Interim History: Dan Farrell presents today for a follow-up visit.  Since the last visit, he reports no major changes in his health.  He continues to work full-time without any major complaints.  He denies any nausea vomiting or abdominal pain.  He denies any complications related to Zytiga.  His hip pain is improved after surgery he continues to work full-time.     Medications: Updated on review. Current Outpatient Medications  Medication Sig Dispense Refill  . abiraterone acetate (ZYTIGA) 250 MG tablet TAKE 4 TABLETS BY MOUTH 1 TIME A DAY ON AN EMPTY STOMACH 1 HOUR BEFORE OR 2 HOURS AFTER A MEAL. 120 tablet 0  . celecoxib (CELEBREX) 50 MG capsule Take 1 capsule (50 mg total) by mouth daily. 4 capsule 0  . fenofibrate 160 MG tablet TAKE 1 TABLET BY MOUTH ONCE A DAY FOR CHOLESTEROL  3  . fluconazole (DIFLUCAN) 100 MG tablet Take 1 tablet (100 mg total) by mouth daily. 5 tablet 0  . Multiple Vitamin (MULTIVITAMIN) tablet Take 1 tablet by mouth daily.    . naproxen sodium (ANAPROX) 220 MG tablet Take 220 mg by mouth 2 (two) times daily with a meal.    . OS-CAL CALCIUM + D3 500-200  MG-UNIT TABS TAKE 1 TABLET BY MOUTH TWICE A DAY 90 tablet 3  . predniSONE (DELTASONE) 5 MG tablet TAKE 1 TABLET BY MOUTH EVERY DAY WITH BREAKFAST 90 tablet 3  . SUMAtriptan Succinate (ZEMBRACE SYMTOUCH) 3 MG/0.5ML SOAJ Inject 3 mg into the skin as directed. At earliest onset, may repeat in 2 hours if headache persists or reoccurs (Patient not taking: Reported on 10/22/2018) 4.5 mL 3  . valsartan-hydrochlorothiazide (DIOVAN-HCT) 160-12.5 MG tablet Take 1 tablet by mouth daily.  0   No current facility-administered medications for this visit.     Allergies: No Known Allergies     Physical Exam:     Blood pressure 132/75, pulse 82, temperature (!) 96.3 F (35.7 C), temperature source Tympanic, resp. rate 17, height 5\' 10"  (1.778 m), weight 175 lb 3.2 oz (79.5 kg), SpO2 96 %.         ECOG: 0    General appearance: Comfortable appearing without any discomfort Head: Normocephalic without any trauma Oropharynx: Mucous membranes are moist and pink without any thrush or ulcers. Eyes: Pupils are equal and round reactive to light. Lymph nodes: No cervical, supraclavicular, inguinal or axillary lymphadenopathy.   Heart:regular rate and rhythm.  S1 and S2 without leg edema. Lung: Clear without any rhonchi or wheezes.  No dullness to percussion. Abdomin: Soft, nontender, nondistended with good bowel sounds.  No hepatosplenomegaly. Musculoskeletal: No joint  deformity or effusion.  Full range of motion noted. Neurological: No deficits noted on motor, sensory and deep tendon reflex exam. Skin: No petechial rash or dryness.  Appeared moist.            CBC    Component Value Date/Time   WBC 4.4 12/20/2020 1005   RBC 3.83 (L) 12/20/2020 1005   HGB 12.0 (L) 12/20/2020 1005   HCT 35.5 (L) 12/20/2020 1005   PLT 251 12/20/2020 1005   MCV 92.7 12/20/2020 1005   MCH 31.3 12/20/2020 1005   MCHC 33.8 12/20/2020 1005   RDW 13.3 12/20/2020 1005   LYMPHSABS 0.7 12/20/2020 1005    MONOABS 0.4 12/20/2020 1005   EOSABS 0.1 12/20/2020 1005   BASOSABS 0.0 12/20/2020 1005         Results for Dan Farrell, Dan Farrell (MRN [id number]) as of 02/23/2021 09:10  Ref. Range 10/25/2020 12:40 12/20/2020 10:05  Prostate Specific Ag, Serum Latest Ref Range: 0.0 - 4.0 ng/mL <0.1 <0.1             Impression and Plan:    60 year old man with:  1.    Castration-sensitive advanced Prostate cancer disease to the bone diagnosed in 2019.    He continues to have excellent response to current therapy with PSA remains undetectable.  Risks and benefits of continuing Zytiga at this time were discussed.  Potential complications including weight gain, edema, hypertension and hypocalcemia were reiterated.  Systemic chemotherapy option will be deferred if he develops castration resistant disease.  2.  Androgen deprivation: Risks and benefits of androgen deprivation therapy continuation were discussed.  Complication clinic osteoporosis, hot flashes and sexual dysfunction were reviewed.  3.  Bone directed therapy: He is currently on Xgeva I recommended continuing.  Will change to every 4 months for better tolerance and convenience.  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  4.  Liver function and electrolyte surveillance: His potassium and liver function tests all within normal range.  We will continue to monitor on Zytiga.  5.  Hypertension: No issues reported at this time we will continue to monitor on Zytiga.  6.  Hip pain: he is status post hip surgery with improvement in his overall mobility and pain.  7.  Prognosis: Therapy remains palliative low risk meds are warranted at this time given excellent performance status and tolerance.  8.  Follow-up: In 4 months for repeat follow-up.   30 minutes were dedicated to this visit.  Time was spent on reviewing disease status, laboratory data, discussing treatment options and complications related to his cancer  and cancer therapy.    Zola Button, MD 4/14/20229:09 AM

## 2021-02-24 ENCOUNTER — Telehealth: Payer: Self-pay | Admitting: *Deleted

## 2021-02-24 LAB — PROSTATE-SPECIFIC AG, SERUM (LABCORP): Prostate Specific Ag, Serum: <0.1 ng/mL (ref 0.0–4.0)

## 2021-02-24 NOTE — Telephone Encounter (Signed)
-----   Message from Wyatt Portela, MD sent at 02/24/2021  1:41 PM EDT ----- Please let him know his PSA is still low

## 2021-02-24 NOTE — Telephone Encounter (Signed)
PC to patient informing him of PSA <0.1 per Dr. Hazeline Junker request.  Patient verbalizes understanding.

## 2021-04-03 ENCOUNTER — Other Ambulatory Visit: Payer: Self-pay | Admitting: Oncology

## 2021-04-11 ENCOUNTER — Other Ambulatory Visit: Payer: Self-pay | Admitting: Oncology

## 2021-04-11 DIAGNOSIS — C61 Malignant neoplasm of prostate: Secondary | ICD-10-CM

## 2021-06-08 ENCOUNTER — Other Ambulatory Visit: Payer: Self-pay | Admitting: Oncology

## 2021-06-08 DIAGNOSIS — C61 Malignant neoplasm of prostate: Secondary | ICD-10-CM

## 2021-06-23 ENCOUNTER — Telehealth: Payer: Self-pay | Admitting: Oncology

## 2021-06-23 NOTE — Telephone Encounter (Signed)
Sch per4/14 los, pt aware.

## 2021-06-27 ENCOUNTER — Inpatient Hospital Stay: Payer: BC Managed Care – PPO | Attending: Oncology

## 2021-06-27 ENCOUNTER — Inpatient Hospital Stay: Payer: BC Managed Care – PPO

## 2021-06-27 ENCOUNTER — Other Ambulatory Visit: Payer: Self-pay

## 2021-06-27 ENCOUNTER — Inpatient Hospital Stay (HOSPITAL_BASED_OUTPATIENT_CLINIC_OR_DEPARTMENT_OTHER): Payer: BC Managed Care – PPO | Admitting: Oncology

## 2021-06-27 VITALS — BP 159/83 | HR 88 | Temp 98.3°F | Resp 18 | Ht 70.0 in | Wt 179.0 lb

## 2021-06-27 DIAGNOSIS — C61 Malignant neoplasm of prostate: Secondary | ICD-10-CM | POA: Insufficient documentation

## 2021-06-27 DIAGNOSIS — Z923 Personal history of irradiation: Secondary | ICD-10-CM | POA: Insufficient documentation

## 2021-06-27 DIAGNOSIS — Z79899 Other long term (current) drug therapy: Secondary | ICD-10-CM | POA: Insufficient documentation

## 2021-06-27 LAB — CMP (CANCER CENTER ONLY)
ALT: 12 U/L (ref 0–44)
AST: 14 U/L — ABNORMAL LOW (ref 15–41)
Albumin: 3.8 g/dL (ref 3.5–5.0)
Alkaline Phosphatase: 46 U/L (ref 38–126)
Anion gap: 11 (ref 5–15)
BUN: 13 mg/dL (ref 6–20)
CO2: 25 mmol/L (ref 22–32)
Calcium: 9.4 mg/dL (ref 8.9–10.3)
Chloride: 101 mmol/L (ref 98–111)
Creatinine: 1.12 mg/dL (ref 0.61–1.24)
GFR, Estimated: >60 mL/min (ref >60)
Glucose, Bld: 124 mg/dL — ABNORMAL HIGH (ref 70–99)
Potassium: 3.7 mmol/L (ref 3.5–5.1)
Sodium: 137 mmol/L (ref 135–145)
Total Bilirubin: 0.3 mg/dL (ref 0.3–1.2)
Total Protein: 7 g/dL (ref 6.5–8.1)

## 2021-06-27 LAB — CBC WITH DIFFERENTIAL (CANCER CENTER ONLY)
Abs Immature Granulocytes: 0.01 10*3/uL (ref 0.00–0.07)
Basophils Absolute: 0 10*3/uL (ref 0.0–0.1)
Basophils Relative: 0 %
Eosinophils Absolute: 0.1 10*3/uL (ref 0.0–0.5)
Eosinophils Relative: 2 %
HCT: 33.8 % — ABNORMAL LOW (ref 39.0–52.0)
Hemoglobin: 11.9 g/dL — ABNORMAL LOW (ref 13.0–17.0)
Immature Granulocytes: 0 %
Lymphocytes Relative: 12 %
Lymphs Abs: 0.6 10*3/uL — ABNORMAL LOW (ref 0.7–4.0)
MCH: 32.7 pg (ref 26.0–34.0)
MCHC: 35.2 g/dL (ref 30.0–36.0)
MCV: 92.9 fL (ref 80.0–100.0)
Monocytes Absolute: 0.4 10*3/uL (ref 0.1–1.0)
Monocytes Relative: 7 %
Neutro Abs: 3.8 10*3/uL (ref 1.7–7.7)
Neutrophils Relative %: 79 %
Platelet Count: 226 10*3/uL (ref 150–400)
RBC: 3.64 MIL/uL — ABNORMAL LOW (ref 4.22–5.81)
RDW: 12.9 % (ref 11.5–15.5)
WBC Count: 4.9 10*3/uL (ref 4.0–10.5)
nRBC: 0 % (ref 0.0–0.2)

## 2021-06-27 MED ORDER — DENOSUMAB 120 MG/1.7ML ~~LOC~~ SOLN
120.0000 mg | Freq: Once | SUBCUTANEOUS | Status: AC
  Administered 2021-06-27: 120 mg via SUBCUTANEOUS
  Filled 2021-06-27: qty 1.7

## 2021-06-27 MED ORDER — LEUPROLIDE ACETATE (4 MONTH) 30 MG ~~LOC~~ KIT
30.0000 mg | PACK | Freq: Once | SUBCUTANEOUS | Status: AC
  Administered 2021-06-27: 30 mg via SUBCUTANEOUS
  Filled 2021-06-27: qty 30

## 2021-06-27 NOTE — Progress Notes (Signed)
Hematology and Oncology Follow Up Visit  Dan Farrell:[id number] 10/15/1961 60 y.o. 06/27/2021 2:30 PM Dan Farrell, MDEhinger, Dan [name], MD   Principle Diagnosis: 60 year old man with castration-sensitive advanced prostate cancer with disease to the bone diagnosed in 2019.  He was found to have PSA of 11 and Gleason score 4+4 = 8 with advanced disease.   Prior Therapy:  Prostate biopsy obtained in May 2019.  Radiation therapy to the prostate completed and November 2019.  He received 45 Gy in 25 fractions followed by 30 Gy in 15 fractions  Current therapy:  Eligard 30 mg every 4 months.  He will receive this today and repeated in 4 months.  Zytiga 1000 mg with prednisone 5 mg started in July 2019  Xgeva 120 mg every 12 weeks.  He will receive this today.  Interim History: Mr. Gotz returns today for a follow-up evaluation.  Since the last visit, he reports no major changes in his health.  He continues to tolerate Zytiga without any major complaints.  He denies any nausea, fatigue or bone pain.  He denies any recent hospitalization or illnesses.  He denies any worsening hip pain since his surgery.     Medications: Unchanged on review. Current Outpatient Medications  Medication Sig Dispense Refill   abiraterone acetate (ZYTIGA) 250 MG tablet TAKE 4 TABLETS BY MOUTH 1 TIME A DAY ON AN EMPTY STOMACH 1 HOUR BEFORE OR 2 HOURS AFTER A MEAL. 120 tablet 0   ASPIRIN LOW DOSE 81 MG chewable tablet Chew by mouth.     Calcium Carb-Cholecalciferol (OS-CAL CALCIUM + D3) 500-200 MG-UNIT TABS Os-Cal + D3     celecoxib (CELEBREX) 50 MG capsule Take 1 capsule (50 mg total) by mouth daily. 4 capsule 0   docusate sodium (COLACE) 100 MG capsule Take 100 mg by mouth 2 (two) times daily.     fenofibrate 160 MG tablet TAKE 1 TABLET BY MOUTH ONCE A DAY FOR CHOLESTEROL  3   ferrous sulfate 325 (65 FE) MG tablet Take 325 mg by mouth 3 (three) times daily.     fluconazole (DIFLUCAN) 100 MG tablet  Take 1 tablet (100 mg total) by mouth daily. 5 tablet 0   HYDROcodone-acetaminophen (NORCO) 7.5-325 MG tablet hydrocodone 7.5 mg-acetaminophen 325 mg tablet     HYDROcodone-acetaminophen (NORCO/VICODIN) 5-325 MG tablet hydrocodone 5 mg-acetaminophen 325 mg tablet     methocarbamol (ROBAXIN) 500 MG tablet Take by mouth.     Multiple Vitamin (MULTI-VITAMIN DAILY PO) 1 tablet     Multiple Vitamin (MULTIVITAMIN) tablet Take 1 tablet by mouth daily.     Naproxen Sodium (ALEVE PO) Aleve     naproxen sodium (ANAPROX) 220 MG tablet Take 220 mg by mouth 2 (two) times daily with a meal.     OS-CAL CALCIUM + D3 500-200 MG-UNIT TABS TAKE 1 TABLET BY MOUTH TWICE A DAY 90 tablet 3   polyethylene glycol (MIRALAX / GLYCOLAX) 17 g packet Take 1 packet by mouth daily.     predniSONE (DELTASONE) 5 MG tablet TAKE 1 TABLET BY MOUTH EVERY DAY WITH BREAKFAST 90 tablet 3   SUMAtriptan Succinate (ZEMBRACE SYMTOUCH) 3 MG/0.5ML SOAJ Inject 3 mg into the skin as directed. At earliest onset, may repeat in 2 hours if headache persists or reoccurs (Patient not taking: Reported on 10/22/2018) 4.5 mL 3   valsartan-hydrochlorothiazide (DIOVAN-HCT) 160-12.5 MG tablet Take 1 tablet by mouth daily.  0   No current facility-administered medications for this visit.     Allergies:  Allergies  Allergen Reactions   Lexapro [Escitalopram]     Other reaction(s): Sexual side effects   Zoloft [Sertraline]     Other reaction(s): Jaw clenching   Lisinopril Rash       Physical Exam:    Blood pressure (!) 159/83, pulse 88, temperature 98.3 F (36.8 C), temperature source Oral, resp. rate 18, height '5\' 10"'$  (1.778 m), weight 179 lb (81.2 kg), SpO2 100 %.           ECOG: 0   General appearance: Alert, awake without any distress. Head: Atraumatic without abnormalities Oropharynx: Without any thrush or ulcers. Eyes: No scleral icterus. Lymph nodes: No lymphadenopathy noted in the cervical, supraclavicular, or  axillary nodes Heart:regular rate and rhythm, without any murmurs or gallops.   Lung: Clear to auscultation without any rhonchi, wheezes or dullness to percussion. Abdomin: Soft, nontender without any shifting dullness or ascites. Musculoskeletal: No clubbing or cyanosis. Neurological: No motor or sensory deficits. Skin: No rashes or lesions.            CBC    Component Value Date/Time   WBC 5.0 02/23/2021 0900   RBC 3.63 (L) 02/23/2021 0900   HGB 11.5 (L) 02/23/2021 0900   HCT 33.2 (L) 02/23/2021 0900   PLT 248 02/23/2021 0900   MCV 91.5 02/23/2021 0900   MCH 31.7 02/23/2021 0900   MCHC 34.6 02/23/2021 0900   RDW 13.8 02/23/2021 0900   LYMPHSABS 0.6 (L) 02/23/2021 0900   MONOABS 0.4 02/23/2021 0900   EOSABS 0.1 02/23/2021 0900   BASOSABS 0.0 02/23/2021 0900          Results for Dan Farrell (MRN Farrell:[id number]) as of 06/27/2021 14:32  Ref. Range 12/20/2020 10:05 02/23/2021 09:00  Prostate Specific Ag, Serum Latest Ref Range: 0.0 - 4.0 ng/mL <0.1 <0.1           Impression and Plan:    60 year old man with:   1.    Advanced prostate cancer with disease to the bone diagnosed in 2019.  He has castration-sensitive disease.  He is currently on Zytiga with excellent PSA response that remains undetectable.  Risks and benefits of continuing this treatment were discussed at this time.  Alternative treatment options including systemic chemotherapy as well as androgen receptor pathway inhibitors.  PARP inhibitor could also be an option if he harbors the appropriate mutation.  For the time being he is agreeable to continue.  2.  Androgen deprivation: He will receive Eligard today and repeated in 4 months.  Complications including weight gain, hot flashes among others were discussed.   3.  Bone directed therapy: He has tolerated Xgeva well without any complaints.  I continue to educate him about potential complications including osteonecrosis of the jaw and  hypocalcemia.  4.  Liver function and electrolyte surveillance: No complications noted at this time including normal potassium and liver function test.  5.  Hypertension: His blood pressure is mildly elevated today but otherwise normal between visits.  We will continue to monitor on Zytiga.  6.  Follow-up: He will return in 4 months for a follow-up visit.   30 minutes were spent on this encounter.  The time was dedicated to reviewing laboratory data, disease status update, discussing treatment choices and future plan of care discussion.   Zola Button, MD 8/16/20222:30 PM

## 2021-06-28 ENCOUNTER — Telehealth: Payer: Self-pay

## 2021-06-28 LAB — PROSTATE-SPECIFIC AG, SERUM (LABCORP): Prostate Specific Ag, Serum: <0.1 ng/mL (ref 0.0–4.0)

## 2021-06-28 NOTE — Telephone Encounter (Signed)
-----   Message from Wyatt Portela, MD sent at 06/28/2021  8:31 AM EDT ----- Please let him know his PSA is still low

## 2021-06-28 NOTE — Telephone Encounter (Signed)
Patient was made aware of his lab results and verbalized understanding.

## 2021-08-07 ENCOUNTER — Other Ambulatory Visit: Payer: Self-pay | Admitting: Oncology

## 2021-08-07 DIAGNOSIS — C61 Malignant neoplasm of prostate: Secondary | ICD-10-CM

## 2021-08-08 ENCOUNTER — Encounter: Payer: Self-pay | Admitting: Oncology

## 2021-10-09 ENCOUNTER — Other Ambulatory Visit: Payer: Self-pay | Admitting: Oncology

## 2021-10-09 DIAGNOSIS — C61 Malignant neoplasm of prostate: Secondary | ICD-10-CM

## 2021-10-10 ENCOUNTER — Other Ambulatory Visit: Payer: Self-pay | Admitting: *Deleted

## 2021-10-10 DIAGNOSIS — C61 Malignant neoplasm of prostate: Secondary | ICD-10-CM

## 2021-10-10 MED ORDER — ABIRATERONE ACETATE 250 MG PO TABS: ORAL_TABLET | ORAL | 0 refills | Status: DC

## 2021-10-10 NOTE — Telephone Encounter (Signed)
Refilled on 11/29. Gardiner Rhyme, RN

## 2021-10-25 ENCOUNTER — Inpatient Hospital Stay: Payer: BC Managed Care – PPO | Admitting: Oncology

## 2021-10-25 ENCOUNTER — Inpatient Hospital Stay: Payer: BC Managed Care – PPO

## 2021-10-31 ENCOUNTER — Other Ambulatory Visit: Payer: Self-pay

## 2021-10-31 ENCOUNTER — Inpatient Hospital Stay: Payer: BC Managed Care – PPO

## 2021-10-31 ENCOUNTER — Inpatient Hospital Stay (HOSPITAL_BASED_OUTPATIENT_CLINIC_OR_DEPARTMENT_OTHER): Payer: BC Managed Care – PPO | Admitting: Oncology

## 2021-10-31 ENCOUNTER — Inpatient Hospital Stay: Payer: BC Managed Care – PPO | Attending: Oncology

## 2021-10-31 VITALS — BP 126/77 | HR 77 | Temp 97.9°F | Resp 16 | Ht 70.0 in | Wt 176.0 lb

## 2021-10-31 DIAGNOSIS — I10 Essential (primary) hypertension: Secondary | ICD-10-CM | POA: Diagnosis not present

## 2021-10-31 DIAGNOSIS — C7951 Secondary malignant neoplasm of bone: Secondary | ICD-10-CM | POA: Insufficient documentation

## 2021-10-31 DIAGNOSIS — Z79899 Other long term (current) drug therapy: Secondary | ICD-10-CM | POA: Diagnosis not present

## 2021-10-31 DIAGNOSIS — Z923 Personal history of irradiation: Secondary | ICD-10-CM | POA: Insufficient documentation

## 2021-10-31 DIAGNOSIS — C61 Malignant neoplasm of prostate: Secondary | ICD-10-CM

## 2021-10-31 LAB — CMP (CANCER CENTER ONLY)
ALT: 11 U/L (ref 0–44)
AST: 14 U/L — ABNORMAL LOW (ref 15–41)
Albumin: 4 g/dL (ref 3.5–5.0)
Alkaline Phosphatase: 54 U/L (ref 38–126)
Anion gap: 8 (ref 5–15)
BUN: 13 mg/dL (ref 6–20)
CO2: 26 mmol/L (ref 22–32)
Calcium: 9.8 mg/dL (ref 8.9–10.3)
Chloride: 101 mmol/L (ref 98–111)
Creatinine: 0.93 mg/dL (ref 0.61–1.24)
GFR, Estimated: >60 mL/min (ref >60)
Glucose, Bld: 110 mg/dL — ABNORMAL HIGH (ref 70–99)
Potassium: 3.9 mmol/L (ref 3.5–5.1)
Sodium: 135 mmol/L (ref 135–145)
Total Bilirubin: 0.3 mg/dL (ref 0.3–1.2)
Total Protein: 6.9 g/dL (ref 6.5–8.1)

## 2021-10-31 LAB — CBC WITH DIFFERENTIAL (CANCER CENTER ONLY)
Abs Immature Granulocytes: 0.01 10*3/uL (ref 0.00–0.07)
Basophils Absolute: 0 10*3/uL (ref 0.0–0.1)
Basophils Relative: 0 %
Eosinophils Absolute: 0.1 10*3/uL (ref 0.0–0.5)
Eosinophils Relative: 2 %
HCT: 34.1 % — ABNORMAL LOW (ref 39.0–52.0)
Hemoglobin: 11.6 g/dL — ABNORMAL LOW (ref 13.0–17.0)
Immature Granulocytes: 0 %
Lymphocytes Relative: 12 %
Lymphs Abs: 0.7 10*3/uL (ref 0.7–4.0)
MCH: 31.3 pg (ref 26.0–34.0)
MCHC: 34 g/dL (ref 30.0–36.0)
MCV: 91.9 fL (ref 80.0–100.0)
Monocytes Absolute: 0.4 10*3/uL (ref 0.1–1.0)
Monocytes Relative: 8 %
Neutro Abs: 4.4 10*3/uL (ref 1.7–7.7)
Neutrophils Relative %: 78 %
Platelet Count: 305 10*3/uL (ref 150–400)
RBC: 3.71 MIL/uL — ABNORMAL LOW (ref 4.22–5.81)
RDW: 13.2 % (ref 11.5–15.5)
WBC Count: 5.7 10*3/uL (ref 4.0–10.5)
nRBC: 0 % (ref 0.0–0.2)

## 2021-10-31 MED ORDER — DENOSUMAB 120 MG/1.7ML ~~LOC~~ SOLN
120.0000 mg | Freq: Once | SUBCUTANEOUS | Status: AC
  Administered 2021-10-31: 11:00:00 120 mg via SUBCUTANEOUS
  Filled 2021-10-31: qty 1.7

## 2021-10-31 MED ORDER — LEUPROLIDE ACETATE (4 MONTH) 30 MG ~~LOC~~ KIT
30.0000 mg | PACK | Freq: Once | SUBCUTANEOUS | Status: AC
  Administered 2021-10-31: 11:00:00 30 mg via SUBCUTANEOUS
  Filled 2021-10-31: qty 30

## 2021-10-31 NOTE — Progress Notes (Signed)
Hematology and Oncology Follow Up Visit  Dan Farrell [id number] [birth year] 60 y.o. 10/31/2021 9:37 AM Dan Farrell, Dan Farrell, MDEhinger, Dan [name], MD   Principle Diagnosis: 60 year old man with advanced prostate cancer diagnosed in 2019 with PSA of 11 and Gleason score 4+4 = 8 and bone involvement.  He has castration sensitive disease.   Prior Therapy:  Prostate biopsy obtained in May 2019.  Radiation therapy to the prostate completed and November 2019.  He received 45 Gy in 25 fractions followed by 30 Gy in 15 fractions  Current therapy:  Eligard 30 mg every 4 months.  This will be repeated today.  Zytiga 1000 mg with prednisone 5 mg started in July 2019  Xgeva 120 mg every 4 months and will be repeated today.  Interim History: Dan Farrell is here for a follow-up visit.  Since last visit, he reports no major changes in his health.  He continues to tolerate the current therapy without any major issues.  He denies any nausea, vomiting or abdominal pain.  He denies any recent hospitalizations or illnesses.  He denies any bone pain or pathological fractures.  His performance status and quality of life remained reasonable.     Medications: Updated on review. Current Outpatient Medications  Medication Sig Dispense Refill   abiraterone acetate (ZYTIGA) 250 MG tablet Take on an empty stomach 1 hour before or 2 hours after a meal 120 tablet 0   ASPIRIN LOW DOSE 81 MG chewable tablet Chew by mouth.     Calcium Carb-Cholecalciferol (OS-CAL CALCIUM + D3) 500-200 MG-UNIT TABS Os-Cal + D3     celecoxib (CELEBREX) 50 MG capsule Take 1 capsule (50 mg total) by mouth daily. 4 capsule 0   docusate sodium (COLACE) 100 MG capsule Take 100 mg by mouth 2 (two) times daily.     fenofibrate 160 MG tablet TAKE 1 TABLET BY MOUTH ONCE A DAY FOR CHOLESTEROL  3   ferrous sulfate 325 (65 FE) MG tablet Take 325 mg by mouth 3 (three) times daily.     fluconazole (DIFLUCAN) 100 MG tablet Take 1 tablet (100 mg  total) by mouth daily. 5 tablet 0   HYDROcodone-acetaminophen (NORCO) 7.5-325 MG tablet hydrocodone 7.5 mg-acetaminophen 325 mg tablet     HYDROcodone-acetaminophen (NORCO/VICODIN) 5-325 MG tablet hydrocodone 5 mg-acetaminophen 325 mg tablet     methocarbamol (ROBAXIN) 500 MG tablet Take by mouth.     Multiple Vitamin (MULTI-VITAMIN DAILY PO) 1 tablet     Multiple Vitamin (MULTIVITAMIN) tablet Take 1 tablet by mouth daily.     Naproxen Sodium (ALEVE PO) Aleve     naproxen sodium (ANAPROX) 220 MG tablet Take 220 mg by mouth 2 (two) times daily with a meal.     OS-CAL CALCIUM + D3 500-200 MG-UNIT TABS TAKE 1 TABLET BY MOUTH TWICE A DAY 90 tablet 3   polyethylene glycol (MIRALAX / GLYCOLAX) 17 g packet Take 1 packet by mouth daily.     predniSONE (DELTASONE) 5 MG tablet TAKE 1 TABLET BY MOUTH EVERY DAY WITH BREAKFAST 90 tablet 3   SUMAtriptan Succinate (ZEMBRACE SYMTOUCH) 3 MG/0.5ML SOAJ Inject 3 mg into the skin as directed. At earliest onset, may repeat in 2 hours if headache persists or reoccurs (Patient not taking: Reported on 10/22/2018) 4.5 mL 3   valsartan-hydrochlorothiazide (DIOVAN-HCT) 160-12.5 MG tablet Take 1 tablet by mouth daily.  0   No current facility-administered medications for this visit.     Allergies:  Allergies  Allergen Reactions   Lexapro [Escitalopram]  Other reaction(s): Sexual side effects   Zoloft [Sertraline]     Other reaction(s): Jaw clenching   Lisinopril Rash       Physical Exam:    Blood pressure 126/77, pulse 77, temperature 97.9 F (36.6 C), temperature source Temporal, resp. rate 16, height 5\' 10"  (1.778 m), weight 176 lb (79.8 kg), SpO2 98 %.           ECOG: 0    General appearance: Comfortable appearing without any discomfort Head: Normocephalic without any trauma Oropharynx: Mucous membranes are moist and pink without any thrush or ulcers. Eyes: Pupils are equal and round reactive to light. Lymph nodes: No cervical,  supraclavicular, inguinal or axillary lymphadenopathy.   Heart:regular rate and rhythm.  S1 and S2 without leg edema. Lung: Clear without any rhonchi or wheezes.  No dullness to percussion. Abdomin: Soft, nontender, nondistended with good bowel sounds.  No hepatosplenomegaly. Musculoskeletal: No joint deformity or effusion.  Full range of motion noted. Neurological: No deficits noted on motor, sensory and deep tendon reflex exam. Skin: No petechial rash or dryness.  Appeared moist.              CBC    Component Value Date/Time   WBC 5.7 10/31/2021 0915   RBC 3.71 (L) 10/31/2021 0915   HGB 11.6 (L) 10/31/2021 0915   HCT 34.1 (L) 10/31/2021 0915   PLT 305 10/31/2021 0915   MCV 91.9 10/31/2021 0915   MCH 31.3 10/31/2021 0915   MCHC 34.0 10/31/2021 0915   RDW 13.2 10/31/2021 0915   LYMPHSABS 0.7 10/31/2021 0915   MONOABS 0.4 10/31/2021 0915   EOSABS 0.1 10/31/2021 0915   BASOSABS 0.0 10/31/2021 0915           Latest Reference Range & Units 06/27/21 14:28  Prostate Specific Ag, Serum 0.0 - 4.0 ng/mL <0.1            Impression and Plan:    60 year old man with:   1.    Castration-sensitive advanced prostate cancer with disease to the bone diagnosed in 2019.    He continues to tolerate Zytiga without any major complaints.  Risks and benefits of continuing this treatment were discussed at this time.  Alternative treatment options including therapy discontinuation among others were reviewed.  At this time I recommended the same dose and schedule an will consider updating his scans in the future.   2.  Androgen deprivation: Risks and benefits of proceeding with treatment discussed today.  Complications that include weight gain, hot flashes among others were reiterated.  He is agreeable to proceed.   3.  Bone directed therapy: Complications with Delton See were reviewed including hypocalcemia and osteonecrosis of the jaw were discussed.  4.  Liver function and  electrolyte surveillance: No issues reported related to Zytiga.  Potassium and liver function tests all within normal range.  5.  Hypertension: No issues at this time.  Blood pressure is within normal range.  6.  Follow-up: In 4 months for repeat follow-up.   30 minutes were dedicated to this visit.  The time was spent on reviewing laboratory data, disease status update and outlining future plan of care.   Zola Button, MD 12/20/20229:37 AM

## 2021-11-01 ENCOUNTER — Telehealth: Payer: Self-pay | Admitting: *Deleted

## 2021-11-01 LAB — PROSTATE-SPECIFIC AG, SERUM (LABCORP): Prostate Specific Ag, Serum: <0.1 ng/mL (ref 0.0–4.0)

## 2021-11-01 NOTE — Telephone Encounter (Signed)
-----   Message from Dan Portela, MD sent at 11/01/2021  8:39 AM EST ----- Please let him know his PSA is still down

## 2021-11-01 NOTE — Telephone Encounter (Signed)
PC to patient, informed him his PSA is <0.1    He verbalizes understanding.

## 2021-11-03 ENCOUNTER — Other Ambulatory Visit: Payer: Self-pay | Admitting: Oncology

## 2021-11-03 DIAGNOSIS — C61 Malignant neoplasm of prostate: Secondary | ICD-10-CM

## 2021-12-18 ENCOUNTER — Other Ambulatory Visit (HOSPITAL_COMMUNITY): Payer: Self-pay

## 2021-12-18 ENCOUNTER — Encounter: Payer: Self-pay | Admitting: Oncology

## 2022-01-08 ENCOUNTER — Other Ambulatory Visit: Payer: Self-pay | Admitting: Oncology

## 2022-01-08 DIAGNOSIS — C61 Malignant neoplasm of prostate: Secondary | ICD-10-CM

## 2022-02-27 ENCOUNTER — Inpatient Hospital Stay: Payer: BC Managed Care – PPO

## 2022-02-27 ENCOUNTER — Telehealth: Payer: Self-pay | Admitting: *Deleted

## 2022-02-27 ENCOUNTER — Telehealth: Payer: Self-pay | Admitting: Oncology

## 2022-02-27 ENCOUNTER — Inpatient Hospital Stay: Payer: BC Managed Care – PPO | Admitting: Oncology

## 2022-02-27 NOTE — Telephone Encounter (Signed)
.  Called patient to schedule appointment per 4/18 inbasket, patient is aware of date and time.   ?

## 2022-02-27 NOTE — Telephone Encounter (Signed)
PC to patient regarding his missed appointments today, he was unaware.  Informed patient scheduling will contact him to reschedule lab, MD & injection appointments.  Scheduling message sent. ?

## 2022-03-01 ENCOUNTER — Inpatient Hospital Stay: Payer: BC Managed Care – PPO | Attending: Oncology

## 2022-03-01 ENCOUNTER — Inpatient Hospital Stay: Payer: BC Managed Care – PPO

## 2022-03-01 ENCOUNTER — Other Ambulatory Visit: Payer: Self-pay

## 2022-03-01 ENCOUNTER — Inpatient Hospital Stay: Payer: BC Managed Care – PPO | Admitting: Oncology

## 2022-03-01 VITALS — BP 122/70 | HR 84 | Temp 98.2°F | Resp 17 | Ht 70.0 in | Wt 175.7 lb

## 2022-03-01 DIAGNOSIS — C61 Malignant neoplasm of prostate: Secondary | ICD-10-CM

## 2022-03-01 DIAGNOSIS — Z923 Personal history of irradiation: Secondary | ICD-10-CM | POA: Diagnosis not present

## 2022-03-01 DIAGNOSIS — Z79899 Other long term (current) drug therapy: Secondary | ICD-10-CM | POA: Insufficient documentation

## 2022-03-01 DIAGNOSIS — I10 Essential (primary) hypertension: Secondary | ICD-10-CM | POA: Diagnosis not present

## 2022-03-01 LAB — CBC WITH DIFFERENTIAL (CANCER CENTER ONLY)
Abs Immature Granulocytes: 0.02 10*3/uL (ref 0.00–0.07)
Basophils Absolute: 0 10*3/uL (ref 0.0–0.1)
Basophils Relative: 1 %
Eosinophils Absolute: 0.1 10*3/uL (ref 0.0–0.5)
Eosinophils Relative: 2 %
HCT: 33.9 % — ABNORMAL LOW (ref 39.0–52.0)
Hemoglobin: 11.4 g/dL — ABNORMAL LOW (ref 13.0–17.0)
Immature Granulocytes: 0 %
Lymphocytes Relative: 14 %
Lymphs Abs: 0.7 10*3/uL (ref 0.7–4.0)
MCH: 30.2 pg (ref 26.0–34.0)
MCHC: 33.6 g/dL (ref 30.0–36.0)
MCV: 89.7 fL (ref 80.0–100.0)
Monocytes Absolute: 0.5 10*3/uL (ref 0.1–1.0)
Monocytes Relative: 9 %
Neutro Abs: 3.6 10*3/uL (ref 1.7–7.7)
Neutrophils Relative %: 74 %
Platelet Count: 325 10*3/uL (ref 150–400)
RBC: 3.78 MIL/uL — ABNORMAL LOW (ref 4.22–5.81)
RDW: 13.6 % (ref 11.5–15.5)
WBC Count: 4.9 10*3/uL (ref 4.0–10.5)
nRBC: 0 % (ref 0.0–0.2)

## 2022-03-01 LAB — CMP (CANCER CENTER ONLY)
ALT: 10 U/L (ref 0–44)
AST: 15 U/L (ref 15–41)
Albumin: 4.1 g/dL (ref 3.5–5.0)
Alkaline Phosphatase: 50 U/L (ref 38–126)
Anion gap: 7 (ref 5–15)
BUN: 12 mg/dL (ref 6–20)
CO2: 26 mmol/L (ref 22–32)
Calcium: 9.3 mg/dL (ref 8.9–10.3)
Chloride: 101 mmol/L (ref 98–111)
Creatinine: 0.92 mg/dL (ref 0.61–1.24)
GFR, Estimated: >60 mL/min (ref >60)
Glucose, Bld: 89 mg/dL (ref 70–99)
Potassium: 4.2 mmol/L (ref 3.5–5.1)
Sodium: 134 mmol/L — ABNORMAL LOW (ref 135–145)
Total Bilirubin: 0.4 mg/dL (ref 0.3–1.2)
Total Protein: 7.4 g/dL (ref 6.5–8.1)

## 2022-03-01 MED ORDER — LEUPROLIDE ACETATE (4 MONTH) 30 MG ~~LOC~~ KIT
30.0000 mg | PACK | Freq: Once | SUBCUTANEOUS | Status: AC
  Administered 2022-03-01: 30 mg via SUBCUTANEOUS
  Filled 2022-03-01: qty 30

## 2022-03-01 MED ORDER — DENOSUMAB 120 MG/1.7ML ~~LOC~~ SOLN
120.0000 mg | Freq: Once | SUBCUTANEOUS | Status: AC
  Administered 2022-03-01: 120 mg via SUBCUTANEOUS
  Filled 2022-03-01: qty 1.7

## 2022-03-01 NOTE — Progress Notes (Signed)
Hematology and Oncology Follow Up Visit ? ?Wenda Low Huguley ?[id number] ?1961-01-15 61 y.o. ?03/01/2022 2:30 PM ?[name], MDEhinger, [name], MD  ? ?Principle Diagnosis: 61 year old man with castration-sensitive advanced prostate cancer with disease to the bone diagnosed in 2019.  He presented with PSA of 11 and Gleason score 4+4 = 8. ? ? ?Prior Therapy: ? ?Prostate biopsy obtained in May 2019. ? ?Radiation therapy to the prostate completed and November 2019.  He received 45 Gy in 25 fractions followed by 30 Gy in 15 fractions ? ?Current therapy: ? ?Eligard 30 mg every 4 months.  He will receive Eligard in April and repeated in 4 months. ? ?Zytiga 1000 mg with prednisone 5 mg started in July 2019 ? ?Xgeva 120 mg every 4 months.  He will receive that today and repeated in 4 months. ? ?Interim History: Mr. Fitzgibbon returns today for a follow-up visit.  Since the last visit, he reports feeling well without any major complaints.  He denies any recent hospitalizations or illnesses.  He denies any complications related to Zytiga.  He denies any lower extremity edema or bone pain.  His performance status and quality of life remains unchanged.  He continues to work full-time. ? ? ? ? ?Medications: Reviewed without changes. ?Current Outpatient Medications  ?Medication Sig Dispense Refill  ? abiraterone acetate (ZYTIGA) 250 MG tablet TAKE 4 TABLETS BY MOUTH ONCE DAILY ON AN EMPTY STOMACH 1 HOUR BEFORE OR 2 HOURS AFTER A MEAL 120 tablet 0  ? ASPIRIN LOW DOSE 81 MG chewable tablet Chew by mouth.    ? Calcium Carb-Cholecalciferol (OS-CAL CALCIUM + D3) 500-200 MG-UNIT TABS Os-Cal + D3    ? celecoxib (CELEBREX) 50 MG capsule Take 1 capsule (50 mg total) by mouth daily. 4 capsule 0  ? docusate sodium (COLACE) 100 MG capsule Take 100 mg by mouth 2 (two) times daily.    ? fenofibrate 160 MG tablet TAKE 1 TABLET BY MOUTH ONCE A DAY FOR CHOLESTEROL  3  ? ferrous sulfate 325 (65 FE) MG tablet Take 325 mg by mouth 3 (three) times  daily.    ? fluconazole (DIFLUCAN) 100 MG tablet Take 1 tablet (100 mg total) by mouth daily. 5 tablet 0  ? HYDROcodone-acetaminophen (NORCO) 7.5-325 MG tablet hydrocodone 7.5 mg-acetaminophen 325 mg tablet    ? HYDROcodone-acetaminophen (NORCO/VICODIN) 5-325 MG tablet hydrocodone 5 mg-acetaminophen 325 mg tablet    ? methocarbamol (ROBAXIN) 500 MG tablet Take by mouth.    ? Multiple Vitamin (MULTI-VITAMIN DAILY PO) 1 tablet    ? Multiple Vitamin (MULTIVITAMIN) tablet Take 1 tablet by mouth daily.    ? Naproxen Sodium (ALEVE PO) Aleve    ? naproxen sodium (ANAPROX) 220 MG tablet Take 220 mg by mouth 2 (two) times daily with a meal.    ? OS-CAL CALCIUM + D3 500-200 MG-UNIT TABS TAKE 1 TABLET BY MOUTH TWICE A DAY 90 tablet 3  ? polyethylene glycol (MIRALAX / GLYCOLAX) 17 g packet Take 1 packet by mouth daily.    ? predniSONE (DELTASONE) 5 MG tablet TAKE 1 TABLET BY MOUTH EVERY DAY WITH BREAKFAST 90 tablet 3  ? SUMAtriptan Succinate (ZEMBRACE SYMTOUCH) 3 MG/0.5ML SOAJ Inject 3 mg into the skin as directed. At earliest onset, may repeat in 2 hours if headache persists or reoccurs (Patient not taking: Reported on 10/22/2018) 4.5 mL 3  ? valsartan-hydrochlorothiazide (DIOVAN-HCT) 160-12.5 MG tablet Take 1 tablet by mouth daily.  0  ? ?No current facility-administered medications for this visit.  ? ? ? ?  Allergies:  ?Allergies  ?Allergen Reactions  ? Lexapro [Escitalopram]   ?  Other reaction(s): Sexual side effects  ? Zoloft [Sertraline]   ?  Other reaction(s): Jaw clenching  ? Lisinopril Rash  ? ? ? ? ? ?Physical Exam: ? ? ? ? ? ? ? ? ?Blood pressure 122/70, pulse 84, temperature 98.2 ?F (36.8 ?C), temperature source Temporal, resp. rate 17, height '5\' 10"'$  (1.778 m), weight 175 lb 11.2 oz (79.7 kg), SpO2 98 %. ? ? ? ? ? ? ?ECOG: 0 ? ? ? ?General appearance: Alert, awake without any distress. ?Head: Atraumatic without abnormalities ?Oropharynx: Without any thrush or ulcers. ?Eyes: No scleral icterus. ?Lymph nodes: No  lymphadenopathy noted in the cervical, supraclavicular, or axillary nodes ?Heart:regular rate and rhythm, without any murmurs or gallops.   ?Lung: Clear to auscultation without any rhonchi, wheezes or dullness to percussion. ?Abdomin: Soft, nontender without any shifting dullness or ascites. ?Musculoskeletal: No clubbing or cyanosis. ?Neurological: No motor or sensory deficits. ?Skin: No rashes or lesions. ? ? ? ? ? ? ? ? ? ? ? ? ? ?CBC ?   ?Component Value Date/Time  ? WBC 5.7 10/31/2021 0915  ? RBC 3.71 (L) 10/31/2021 0915  ? HGB 11.6 (L) 10/31/2021 0915  ? HCT 34.1 (L) 10/31/2021 0915  ? PLT 305 10/31/2021 0915  ? MCV 91.9 10/31/2021 0915  ? MCH 31.3 10/31/2021 0915  ? MCHC 34.0 10/31/2021 0915  ? RDW 13.2 10/31/2021 0915  ? LYMPHSABS 0.7 10/31/2021 0915  ? MONOABS 0.4 10/31/2021 0915  ? EOSABS 0.1 10/31/2021 0915  ? BASOSABS 0.0 10/31/2021 0915  ? ? ? ? ? ? ? Latest Reference Range & Units 02/23/21 09:00 06/27/21 14:28 10/31/21 09:15  ?Prostate Specific Ag, Serum 0.0 - 4.0 ng/mL <0.1 <0.1 <0.1  ? ? ? ? ? ? ? ? ? ? ? ? ?Impression and Plan: ? ? ? 61 year old man with: ?  ?1.    Advanced prostate cancer with disease to the bone diagnosed in 2019.  He has castration-sensitive disease at this time. ? ?His PSA continues to be undetectable without any major complications.  Risks and benefits of continuing this treatment were reviewed at this time.  Alternative treatment options including Taxotere chemotherapy, PARP inhibitor among others were reiterated.  At this time I recommended continuing same dose and schedule.  He is agreeable to continue. ? ? ?2.  Androgen deprivation: He will receive Eligard today and repeated in 4 months.  Complications including weight gain, hot flashes, and sexual dysfunction.  Is agreeable to proceed today. ? ? ?3.  Bone directed therapy: He is currently on Xgeva without any complications.  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 ? ?4.  Liver function and electrolyte  surveillance: We will continue to monitor on Zytiga without any issues at this time.  No abnormalities detected today. ? ?5.  Hypertension: We will continue to monitor on Zytiga.  His blood pressure is within normal range. ? ?6.  Follow-up: This will be repeated in 4 months. ? ? ?30 minutes were spent on this encounter.  The time was dedicated to review laboratory data, disease is up-to-date outlining future plan of care.  ? ? ?Zola Button, MD ?4/20/20232:30 PM ?

## 2022-03-02 LAB — PROSTATE-SPECIFIC AG, SERUM (LABCORP): Prostate Specific Ag, Serum: <0.1 ng/mL (ref 0.0–4.0)

## 2022-03-12 ENCOUNTER — Other Ambulatory Visit: Payer: Self-pay | Admitting: Oncology

## 2022-03-12 DIAGNOSIS — C61 Malignant neoplasm of prostate: Secondary | ICD-10-CM

## 2022-04-03 ENCOUNTER — Other Ambulatory Visit: Payer: Self-pay | Admitting: Oncology

## 2022-04-04 ENCOUNTER — Encounter: Payer: Self-pay | Admitting: Oncology

## 2022-04-04 ENCOUNTER — Telehealth: Payer: Self-pay | Admitting: Pharmacy Technician

## 2022-04-04 ENCOUNTER — Other Ambulatory Visit (HOSPITAL_COMMUNITY): Payer: Self-pay

## 2022-04-04 NOTE — Telephone Encounter (Signed)
Oral Oncology Patient Advocate Encounter   Received notification from [facility] that prior authorization for abiraterone acetate (ZYTIGA) is due for renewal.   PA submitted on 04/04/2022 Key [id number] Status is pending     Lady Deutscher, [place] Patient Advocate Specialist [place] Patient Advocate Team Direct Number: [phone]  Fax: [phone]

## 2022-04-04 NOTE — Telephone Encounter (Signed)
Oral Oncology Patient Advocate Encounter  Prior Authorization for abiraterone acetate (ZYTIGA) has been approved.    PA# [id number] Effective dates: 04/04/2022 through 04/05/2023  Patient will continue to fill at [facility].  Lady Deutscher, CPhT-Adv Pharmacy Patient Advocate Specialist [place] Patient Advocate Team Direct Number: [phone]  Fax: [phone]

## 2022-04-06 ENCOUNTER — Other Ambulatory Visit: Payer: Self-pay | Admitting: Oncology

## 2022-04-06 DIAGNOSIS — C61 Malignant neoplasm of prostate: Secondary | ICD-10-CM

## 2022-05-07 ENCOUNTER — Other Ambulatory Visit: Payer: Self-pay | Admitting: Oncology

## 2022-05-07 DIAGNOSIS — C61 Malignant neoplasm of prostate: Secondary | ICD-10-CM

## 2022-05-22 ENCOUNTER — Telehealth: Payer: Self-pay | Admitting: Oncology

## 2022-05-22 NOTE — Telephone Encounter (Signed)
Called patient regarding upcoming September appointments, patient has been called and notified. 

## 2022-06-04 ENCOUNTER — Other Ambulatory Visit: Payer: Self-pay | Admitting: Oncology

## 2022-06-04 DIAGNOSIS — C61 Malignant neoplasm of prostate: Secondary | ICD-10-CM

## 2022-06-05 ENCOUNTER — Other Ambulatory Visit: Payer: Self-pay | Admitting: *Deleted

## 2022-06-05 DIAGNOSIS — C61 Malignant neoplasm of prostate: Secondary | ICD-10-CM

## 2022-06-05 MED ORDER — ABIRATERONE ACETATE 250 MG PO TABS: ORAL_TABLET | ORAL | 0 refills | Status: DC

## 2022-06-06 ENCOUNTER — Encounter: Payer: Self-pay | Admitting: Oncology

## 2022-06-14 ENCOUNTER — Other Ambulatory Visit: Payer: Self-pay | Admitting: *Deleted

## 2022-06-14 DIAGNOSIS — C61 Malignant neoplasm of prostate: Secondary | ICD-10-CM

## 2022-06-14 MED ORDER — ABIRATERONE ACETATE 250 MG PO TABS: ORAL_TABLET | ORAL | 0 refills | Status: DC

## 2022-06-28 ENCOUNTER — Inpatient Hospital Stay (HOSPITAL_BASED_OUTPATIENT_CLINIC_OR_DEPARTMENT_OTHER): Payer: BC Managed Care – PPO | Admitting: Oncology

## 2022-06-28 ENCOUNTER — Inpatient Hospital Stay: Payer: BC Managed Care – PPO

## 2022-06-28 ENCOUNTER — Inpatient Hospital Stay: Payer: BC Managed Care – PPO | Attending: Oncology

## 2022-06-28 ENCOUNTER — Other Ambulatory Visit: Payer: Self-pay

## 2022-06-28 VITALS — BP 138/73 | HR 79 | Temp 97.4°F | Resp 18 | Ht 70.0 in | Wt 177.5 lb

## 2022-06-28 DIAGNOSIS — Z79899 Other long term (current) drug therapy: Secondary | ICD-10-CM | POA: Insufficient documentation

## 2022-06-28 DIAGNOSIS — C61 Malignant neoplasm of prostate: Secondary | ICD-10-CM

## 2022-06-28 DIAGNOSIS — Z923 Personal history of irradiation: Secondary | ICD-10-CM | POA: Insufficient documentation

## 2022-06-28 DIAGNOSIS — C7951 Secondary malignant neoplasm of bone: Secondary | ICD-10-CM | POA: Insufficient documentation

## 2022-06-28 DIAGNOSIS — I10 Essential (primary) hypertension: Secondary | ICD-10-CM | POA: Diagnosis not present

## 2022-06-28 LAB — CBC WITH DIFFERENTIAL (CANCER CENTER ONLY)
Abs Immature Granulocytes: 0.02 10*3/uL (ref 0.00–0.07)
Basophils Absolute: 0 10*3/uL (ref 0.0–0.1)
Basophils Relative: 0 %
Eosinophils Absolute: 0.1 10*3/uL (ref 0.0–0.5)
Eosinophils Relative: 2 %
HCT: 33.1 % — ABNORMAL LOW (ref 39.0–52.0)
Hemoglobin: 11.7 g/dL — ABNORMAL LOW (ref 13.0–17.0)
Immature Granulocytes: 0 %
Lymphocytes Relative: 16 %
Lymphs Abs: 0.7 10*3/uL (ref 0.7–4.0)
MCH: 30.9 pg (ref 26.0–34.0)
MCHC: 35.3 g/dL (ref 30.0–36.0)
MCV: 87.3 fL (ref 80.0–100.0)
Monocytes Absolute: 0.4 10*3/uL (ref 0.1–1.0)
Monocytes Relative: 8 %
Neutro Abs: 3.5 10*3/uL (ref 1.7–7.7)
Neutrophils Relative %: 74 %
Platelet Count: 238 10*3/uL (ref 150–400)
RBC: 3.79 MIL/uL — ABNORMAL LOW (ref 4.22–5.81)
RDW: 13.9 % (ref 11.5–15.5)
WBC Count: 4.7 10*3/uL (ref 4.0–10.5)
nRBC: 0 % (ref 0.0–0.2)

## 2022-06-28 LAB — CMP (CANCER CENTER ONLY)
ALT: 10 U/L (ref 0–44)
AST: 16 U/L (ref 15–41)
Albumin: 4.2 g/dL (ref 3.5–5.0)
Alkaline Phosphatase: 46 U/L (ref 38–126)
Anion gap: 7 (ref 5–15)
BUN: 12 mg/dL (ref 6–20)
CO2: 25 mmol/L (ref 22–32)
Calcium: 9.1 mg/dL (ref 8.9–10.3)
Chloride: 99 mmol/L (ref 98–111)
Creatinine: 0.85 mg/dL (ref 0.61–1.24)
GFR, Estimated: >60 mL/min (ref >60)
Glucose, Bld: 96 mg/dL (ref 70–99)
Potassium: 3.8 mmol/L (ref 3.5–5.1)
Sodium: 131 mmol/L — ABNORMAL LOW (ref 135–145)
Total Bilirubin: 0.4 mg/dL (ref 0.3–1.2)
Total Protein: 7.2 g/dL (ref 6.5–8.1)

## 2022-06-28 MED ORDER — DENOSUMAB 120 MG/1.7ML ~~LOC~~ SOLN
120.0000 mg | Freq: Once | SUBCUTANEOUS | Status: AC
  Administered 2022-06-28: 120 mg via SUBCUTANEOUS
  Filled 2022-06-28: qty 1.7

## 2022-06-28 MED ORDER — LEUPROLIDE ACETATE (4 MONTH) 30 MG ~~LOC~~ KIT
30.0000 mg | PACK | Freq: Once | SUBCUTANEOUS | Status: AC
  Administered 2022-06-28: 30 mg via SUBCUTANEOUS
  Filled 2022-06-28: qty 30

## 2022-06-28 NOTE — Progress Notes (Signed)
Hematology and Oncology Follow Up Visit  Dan Farrell [id number] 31-Aug-1961 61 y.o. 06/28/2022 9:07 AM Dan Farrell, [name], MDEhinger, [name], MD   Principle Diagnosis: 61 year old man with advanced prostate cancer with disease to the bone diagnosed in 2019.  He has castration-sensitive after presenting with PSA of 11 and Gleason score 4+4 = 8 at the time of diagnosis.   Prior Therapy:  Prostate biopsy obtained in May 2019.  Radiation therapy to the prostate completed and November 2019.  He received 45 Gy in 25 fractions followed by 30 Gy in 15 fractions  Current therapy:  Eligard 30 mg every 4 months.  He will receive Eligard today and repeated in 4 months.  Zytiga 1000 mg with prednisone 5 mg started in July 2019  Xgeva 120 mg every 4 months.  Next injection is scheduled for today.  Interim History: Mr. Mathe returns today for repeat evaluation.  Since the last visit, he reports feeling well without any major complaints.  He does report hot flashes occasionally but no other complaints.  He denies any bone pain or pathological fractures.  He denies any hospitalizations or illnesses.  Continues to work full-time.    Medications: Updated on review. Current Outpatient Medications  Medication Sig Dispense Refill   abiraterone acetate (ZYTIGA) 250 MG tablet Take 4 tablets (1000 mg) daily on an empty stomach 1 hour before or 2 hours after a meal 120 tablet 0   ASPIRIN LOW DOSE 81 MG chewable tablet Chew by mouth.     Calcium Carb-Cholecalciferol (OS-CAL CALCIUM + D3) 500-200 MG-UNIT TABS Os-Cal + D3     celecoxib (CELEBREX) 50 MG capsule Take 1 capsule (50 mg total) by mouth daily. 4 capsule 0   docusate sodium (COLACE) 100 MG capsule Take 100 mg by mouth 2 (two) times daily.     fenofibrate 160 MG tablet TAKE 1 TABLET BY MOUTH ONCE A DAY FOR CHOLESTEROL  3   ferrous sulfate 325 (65 FE) MG tablet Take 325 mg by mouth 3 (three) times daily.     fluconazole (DIFLUCAN) 100 MG tablet  Take 1 tablet (100 mg total) by mouth daily. 5 tablet 0   HYDROcodone-acetaminophen (NORCO) 7.5-325 MG tablet hydrocodone 7.5 mg-acetaminophen 325 mg tablet     HYDROcodone-acetaminophen (NORCO/VICODIN) 5-325 MG tablet hydrocodone 5 mg-acetaminophen 325 mg tablet     methocarbamol (ROBAXIN) 500 MG tablet Take by mouth.     Multiple Vitamin (MULTI-VITAMIN DAILY PO) 1 tablet     Multiple Vitamin (MULTIVITAMIN) tablet Take 1 tablet by mouth daily.     Naproxen Sodium (ALEVE PO) Aleve     naproxen sodium (ANAPROX) 220 MG tablet Take 220 mg by mouth 2 (two) times daily with a meal.     OS-CAL CALCIUM + D3 500-200 MG-UNIT TABS TAKE 1 TABLET BY MOUTH TWICE A DAY 90 tablet 3   polyethylene glycol (MIRALAX / GLYCOLAX) 17 g packet Take 1 packet by mouth daily.     predniSONE (DELTASONE) 5 MG tablet TAKE 1 TABLET BY MOUTH EVERY DAY WITH BREAKFAST 90 tablet 3   SUMAtriptan Succinate (ZEMBRACE SYMTOUCH) 3 MG/0.5ML SOAJ Inject 3 mg into the skin as directed. At earliest onset, may repeat in 2 hours if headache persists or reoccurs (Patient not taking: Reported on 10/22/2018) 4.5 mL 3   valsartan-hydrochlorothiazide (DIOVAN-HCT) 160-12.5 MG tablet Take 1 tablet by mouth daily.  0   No current facility-administered medications for this visit.     Allergies:  Allergies  Allergen Reactions  Lexapro [Escitalopram]     Other reaction(s): Sexual side effects   Zoloft [Sertraline]     Other reaction(s): Jaw clenching   Lisinopril Rash       Physical Exam:    Blood pressure 138/73, pulse 79, temperature (!) 97.4 F (36.3 C), temperature source Tympanic, resp. rate 18, height '5\' 10"'$  (1.778 m), weight 177 lb 8 oz (80.5 kg), SpO2 99 %.             ECOG: 0    General appearance: Comfortable appearing without any discomfort Head: Normocephalic without any trauma Oropharynx: Mucous membranes are moist and pink without any thrush or ulcers. Eyes: Pupils are equal and round reactive to  light. Lymph nodes: No cervical, supraclavicular, inguinal or axillary lymphadenopathy.   Heart:regular rate and rhythm.  S1 and S2 without leg edema. Lung: Clear without any rhonchi or wheezes.  No dullness to percussion. Abdomin: Soft, nontender, nondistended with good bowel sounds.  No hepatosplenomegaly. Musculoskeletal: No joint deformity or effusion.  Full range of motion noted. Neurological: No deficits noted on motor, sensory and deep tendon reflex exam. Skin: No petechial rash or dryness.  Appeared moist.                CBC    Component Value Date/Time   WBC 4.9 03/01/2022 1423   RBC 3.78 (L) 03/01/2022 1423   HGB 11.4 (L) 03/01/2022 1423   HCT 33.9 (L) 03/01/2022 1423   PLT 325 03/01/2022 1423   MCV 89.7 03/01/2022 1423   MCH 30.2 03/01/2022 1423   MCHC 33.6 03/01/2022 1423   RDW 13.6 03/01/2022 1423   LYMPHSABS 0.7 03/01/2022 1423   MONOABS 0.5 03/01/2022 1423   EOSABS 0.1 03/01/2022 1423   BASOSABS 0.0 03/01/2022 1423         Latest Reference Range & Units 10/31/21 09:15 03/01/22 14:23  Prostate Specific Ag, Serum 0.0 - 4.0 ng/mL <0.1 <0.1              Impression and Plan:    61 year old man with:   1.   Castration-sensitive advanced prostate cancer with disease to the bone diagnosed in 2019.   The natural course of his disease was reviewed at this time and treatment options were discussed.  He continues to have excellent response to therapy with PSA remains undetectable.  Risks and benefits of continuing this treatment and the option of therapy de-escalation were discussed.  I would recommend obtaining a PSMA PET scan to assess the status of his disease before consideration of therapy de-escalation.  He is agreeable with this plan.   2.  Androgen deprivation: He is currently on Eligard without any complications.  He has issues including hot flashes, weight gain among others were reiterated.  He is agreeable to proceed today.   3.   Bone directed therapy: He is currently on Eligard without any issues.  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  4.  Liver function and electrolyte surveillance: No issues reported at this time with normal potassium and liver function test.  5.  Hypertension: Currently within normal range without any issues.  6.  Follow-up: In 4 months for a follow-up.   30 minutes were dedicated to this visit.  The time was spent on reviewing laboratory data, disease status update and outlining future plan of care discussion.   Zola Button, MD 8/17/20239:07 AM

## 2022-06-28 NOTE — Addendum Note (Signed)
Addended by: Wyatt Portela on: 06/28/2022 10:04 AM   Modules accepted: Orders

## 2022-06-30 LAB — PROSTATE-SPECIFIC AG, SERUM (LABCORP): Prostate Specific Ag, Serum: <0.1 ng/mL (ref 0.0–4.0)

## 2022-07-02 ENCOUNTER — Telehealth: Payer: Self-pay | Admitting: *Deleted

## 2022-07-02 NOTE — Telephone Encounter (Signed)
Notified of message below

## 2022-07-02 NOTE — Telephone Encounter (Signed)
-----   Message from Wyatt Portela, MD sent at 07/02/2022 10:14 AM EDT ----- Please let him know his PSA is still low

## 2022-07-06 ENCOUNTER — Other Ambulatory Visit: Payer: Self-pay | Admitting: Oncology

## 2022-07-06 DIAGNOSIS — C61 Malignant neoplasm of prostate: Secondary | ICD-10-CM

## 2022-08-06 ENCOUNTER — Other Ambulatory Visit: Payer: Self-pay | Admitting: Oncology

## 2022-08-06 DIAGNOSIS — C61 Malignant neoplasm of prostate: Secondary | ICD-10-CM

## 2022-08-27 ENCOUNTER — Encounter (HOSPITAL_COMMUNITY): Payer: BC Managed Care – PPO

## 2022-09-05 ENCOUNTER — Other Ambulatory Visit: Payer: Self-pay | Admitting: Oncology

## 2022-09-05 DIAGNOSIS — C61 Malignant neoplasm of prostate: Secondary | ICD-10-CM

## 2022-10-03 ENCOUNTER — Other Ambulatory Visit: Payer: Self-pay | Admitting: Oncology

## 2022-10-03 DIAGNOSIS — C61 Malignant neoplasm of prostate: Secondary | ICD-10-CM

## 2022-10-16 ENCOUNTER — Telehealth: Payer: Self-pay | Admitting: *Deleted

## 2022-10-16 NOTE — Telephone Encounter (Signed)
Dr Tonita Cong has been seeing Mr Dan Farrell. Pt has had persistent thoracic pain related to an injury while playing with his dog ~ 1 month ago. Patient told him that Dr Alen Blew had wanted a PET scan that was denied to evaluate cancer. Dr Tonita Cong says the pain that from the injury should have resolved by now. He supports a PET as well and will provide information to insurance if needed. Pt to see Dr Alen Blew 12/12. Pt will discuss at that time.

## 2022-10-23 ENCOUNTER — Inpatient Hospital Stay (HOSPITAL_BASED_OUTPATIENT_CLINIC_OR_DEPARTMENT_OTHER): Payer: BC Managed Care – PPO | Admitting: Oncology

## 2022-10-23 ENCOUNTER — Inpatient Hospital Stay: Payer: BC Managed Care – PPO

## 2022-10-23 ENCOUNTER — Other Ambulatory Visit: Payer: Self-pay

## 2022-10-23 ENCOUNTER — Inpatient Hospital Stay: Payer: BC Managed Care – PPO | Attending: Oncology

## 2022-10-23 VITALS — BP 153/83 | HR 84 | Temp 97.5°F | Resp 15 | Wt 172.2 lb

## 2022-10-23 DIAGNOSIS — C7951 Secondary malignant neoplasm of bone: Secondary | ICD-10-CM | POA: Insufficient documentation

## 2022-10-23 DIAGNOSIS — C61 Malignant neoplasm of prostate: Secondary | ICD-10-CM | POA: Insufficient documentation

## 2022-10-23 DIAGNOSIS — Z5111 Encounter for antineoplastic chemotherapy: Secondary | ICD-10-CM | POA: Diagnosis present

## 2022-10-23 DIAGNOSIS — M545 Low back pain, unspecified: Secondary | ICD-10-CM | POA: Diagnosis not present

## 2022-10-23 DIAGNOSIS — Z923 Personal history of irradiation: Secondary | ICD-10-CM | POA: Insufficient documentation

## 2022-10-23 DIAGNOSIS — Z79818 Long term (current) use of other agents affecting estrogen receptors and estrogen levels: Secondary | ICD-10-CM | POA: Diagnosis not present

## 2022-10-23 LAB — CBC WITH DIFFERENTIAL (CANCER CENTER ONLY)
Abs Immature Granulocytes: 0.01 10*3/uL (ref 0.00–0.07)
Basophils Absolute: 0 10*3/uL (ref 0.0–0.1)
Basophils Relative: 0 %
Eosinophils Absolute: 0.2 10*3/uL (ref 0.0–0.5)
Eosinophils Relative: 3 %
HCT: 34.6 % — ABNORMAL LOW (ref 39.0–52.0)
Hemoglobin: 12.1 g/dL — ABNORMAL LOW (ref 13.0–17.0)
Immature Granulocytes: 0 %
Lymphocytes Relative: 19 %
Lymphs Abs: 0.8 10*3/uL (ref 0.7–4.0)
MCH: 32.3 pg (ref 26.0–34.0)
MCHC: 35 g/dL (ref 30.0–36.0)
MCV: 92.3 fL (ref 80.0–100.0)
Monocytes Absolute: 0.4 10*3/uL (ref 0.1–1.0)
Monocytes Relative: 9 %
Neutro Abs: 3 10*3/uL (ref 1.7–7.7)
Neutrophils Relative %: 69 %
Platelet Count: 223 10*3/uL (ref 150–400)
RBC: 3.75 MIL/uL — ABNORMAL LOW (ref 4.22–5.81)
RDW: 13.2 % (ref 11.5–15.5)
WBC Count: 4.5 10*3/uL (ref 4.0–10.5)
nRBC: 0 % (ref 0.0–0.2)

## 2022-10-23 LAB — CMP (CANCER CENTER ONLY)
ALT: 11 U/L (ref 0–44)
AST: 16 U/L (ref 15–41)
Albumin: 4.3 g/dL (ref 3.5–5.0)
Alkaline Phosphatase: 35 U/L — ABNORMAL LOW (ref 38–126)
Anion gap: 7 (ref 5–15)
BUN: 20 mg/dL (ref 6–20)
CO2: 28 mmol/L (ref 22–32)
Calcium: 9.7 mg/dL (ref 8.9–10.3)
Chloride: 102 mmol/L (ref 98–111)
Creatinine: 0.95 mg/dL (ref 0.61–1.24)
GFR, Estimated: >60 mL/min (ref >60)
Glucose, Bld: 87 mg/dL (ref 70–99)
Potassium: 4 mmol/L (ref 3.5–5.1)
Sodium: 137 mmol/L (ref 135–145)
Total Bilirubin: 0.6 mg/dL (ref 0.3–1.2)
Total Protein: 7 g/dL (ref 6.5–8.1)

## 2022-10-23 MED ORDER — DENOSUMAB 120 MG/1.7ML ~~LOC~~ SOLN
120.0000 mg | Freq: Once | SUBCUTANEOUS | Status: AC
  Administered 2022-10-23: 120 mg via SUBCUTANEOUS
  Filled 2022-10-23: qty 1.7

## 2022-10-23 MED ORDER — LEUPROLIDE ACETATE (4 MONTH) 30 MG ~~LOC~~ KIT
30.0000 mg | PACK | Freq: Once | SUBCUTANEOUS | Status: AC
  Administered 2022-10-23: 30 mg via SUBCUTANEOUS
  Filled 2022-10-23: qty 30

## 2022-10-23 NOTE — Progress Notes (Signed)
Hematology and Oncology Follow Up Visit  Dan Farrell [id number] 06/07/1961 61 y.o. 10/23/2022 2:20 PM Dan Farrell, MDEhinger, Dan [name], MD   Principle Diagnosis: 61 year old man with castration-sensitive advanced prostate cancer with disease to the bone diagnosed in 2019.  He presented with PSA of 11 and Gleason score 4+4 = 8.    Prior Therapy:  Prostate biopsy obtained in May 2019.  Radiation therapy to the prostate completed and November 2019.  He received 45 Gy in 25 fractions followed by 30 Gy in 15 fractions  Current therapy:  Eligard 30 mg every 4 months.  Next injection will be received on 10/23/2022.  Zytiga 1000 mg with prednisone 5 mg started in July 2019  Xgeva 120 mg every 4 months.  This will be given again today and repeated in 4 months.   Interim History: Dan Farrell presents today for a follow-up visit.  Since last visit, he reports no major changes in his health.  He has continued to have lower back pain predominantly midthoracic to upper lumbar spine.  He denies any neurological deficits or weakness.  He denies any recent falls or syncope.  He denies any hospitalizations.  He continues to work full-time but does feel his pain consistently.  He has tolerated Zytiga without any new complications.  He denies excessive fatigue, tiredness or weakness.   Medications: Reviewed without changes. Current Outpatient Medications  Medication Sig Dispense Refill   abiraterone acetate (ZYTIGA) 250 MG tablet TAKE 4 TABLETS BY MOUTH 1 TIME A DAY ON AN EMPTY STOMACH 1 HOUR BEFORE OR 2 HOURS AFTER A MEAL 120 tablet 0   ASPIRIN LOW DOSE 81 MG chewable tablet Chew by mouth.     Calcium Carb-Cholecalciferol (OS-CAL CALCIUM + D3) 500-200 MG-UNIT TABS Os-Cal + D3     celecoxib (CELEBREX) 50 MG capsule Take 1 capsule (50 mg total) by mouth daily. 4 capsule 0   docusate sodium (COLACE) 100 MG capsule Take 100 mg by mouth 2 (two) times daily.     fenofibrate 160 MG tablet TAKE 1  TABLET BY MOUTH ONCE A DAY FOR CHOLESTEROL  3   ferrous sulfate 325 (65 FE) MG tablet Take 325 mg by mouth 3 (three) times daily.     fluconazole (DIFLUCAN) 100 MG tablet Take 1 tablet (100 mg total) by mouth daily. 5 tablet 0   HYDROcodone-acetaminophen (NORCO) 7.5-325 MG tablet hydrocodone 7.5 mg-acetaminophen 325 mg tablet     HYDROcodone-acetaminophen (NORCO/VICODIN) 5-325 MG tablet hydrocodone 5 mg-acetaminophen 325 mg tablet     methocarbamol (ROBAXIN) 500 MG tablet Take by mouth.     Multiple Vitamin (MULTI-VITAMIN DAILY PO) 1 tablet     Multiple Vitamin (MULTIVITAMIN) tablet Take 1 tablet by mouth daily.     Naproxen Sodium (ALEVE PO) Aleve     naproxen sodium (ANAPROX) 220 MG tablet Take 220 mg by mouth 2 (two) times daily with a meal.     OS-CAL CALCIUM + D3 500-200 MG-UNIT TABS TAKE 1 TABLET BY MOUTH TWICE A DAY 90 tablet 3   polyethylene glycol (MIRALAX / GLYCOLAX) 17 g packet Take 1 packet by mouth daily.     predniSONE (DELTASONE) 5 MG tablet TAKE 1 TABLET BY MOUTH EVERY DAY WITH BREAKFAST 90 tablet 3   SUMAtriptan Succinate (ZEMBRACE SYMTOUCH) 3 MG/0.5ML SOAJ Inject 3 mg into the skin as directed. At earliest onset, may repeat in 2 hours if headache persists or reoccurs (Patient not taking: Reported on 10/22/2018) 4.5 mL 3   valsartan-hydrochlorothiazide (DIOVAN-HCT)  160-12.5 MG tablet Take 1 tablet by mouth daily.  0   No current facility-administered medications for this visit.     Allergies:  Allergies  Allergen Reactions   Lexapro [Escitalopram]     Other reaction(s): Sexual side effects   Zoloft [Sertraline]     Other reaction(s): Jaw clenching   Lisinopril Rash       Physical Exam:            Blood pressure (!) 153/83, pulse 84, temperature (!) 97.5 F (36.4 C), temperature source Temporal, resp. rate 15, weight 172 lb 3.2 oz (78.1 kg), SpO2 98 %.      ECOG: 0   General appearance: Alert, awake without any distress. Head: Atraumatic without  abnormalities Oropharynx: Without any thrush or ulcers. Eyes: No scleral icterus. Lymph nodes: No lymphadenopathy noted in the cervical, supraclavicular, or axillary nodes Heart:regular rate and rhythm, without any murmurs or gallops.   Lung: Clear to auscultation without any rhonchi, wheezes or dullness to percussion. Abdomin: Soft, nontender without any shifting dullness or ascites. Musculoskeletal: No clubbing or cyanosis. Neurological: No motor or sensory deficits. Skin: No rashes or lesions.               CBC    Component Value Date/Time   WBC 4.7 06/28/2022 0927   RBC 3.79 (L) 06/28/2022 0927   HGB 11.7 (L) 06/28/2022 0927   HCT 33.1 (L) 06/28/2022 0927   PLT 238 06/28/2022 0927   MCV 87.3 06/28/2022 0927   MCH 30.9 06/28/2022 0927   MCHC 35.3 06/28/2022 0927   RDW 13.9 06/28/2022 0927   LYMPHSABS 0.7 06/28/2022 0927   MONOABS 0.4 06/28/2022 0927   EOSABS 0.1 06/28/2022 0927   BASOSABS 0.0 06/28/2022 0927               Latest Reference Range & Units 03/01/22 14:23 06/28/22 09:27  Prostate Specific Ag, Serum 0.0 - 4.0 ng/mL <0.1 <0.1       Impression and Plan:    61 year old man with:   1.   Castration-sensitive advanced prostate cancer with disease to the bone diagnosed in 2019.   The natural course of his disease was discussed at this time and treatment choices were reviewed.  His PSA continues to be undetectable.  Treatment options at this time including Taxotere chemotherapy, PARP inhibitor among others will be deferred unless he has documented disease progression.  He has not had any complications related to [facility] with excellent PSA response.   2.  Androgen deprivation: Will proceed with Eligard today and continued indefinitely.  Complications including weight gain, hot flashes among others were discussed.   3.  Bone directed therapy: He is currently on Xgeva without any issues.  Complications that include osteonecrosis of the jaw and  hypocalcemia were discussed.  4.  Liver function and electrolyte surveillance: No issues reported at this time.  Will continue to monitor the [place] on Zytiga.  5.  Back pain: Unclear etiology and is unlikely to be related to prostate cancer given his low PSA.  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  There is no evidence of metastatic cancer is detected, recommended following with orthopedics regarding this issue.  6.  Follow-up: He will return in 4 months for a follow-up.   30 minutes were dedicated to this visit.  The time was spent on reviewing laboratory data, disease status update and outlining future plan of care discussion.  Zola Button, MD 12/12/20232:20 PM

## 2022-10-24 ENCOUNTER — Telehealth: Payer: Self-pay | Admitting: *Deleted

## 2022-10-24 LAB — PROSTATE-SPECIFIC AG, SERUM (LABCORP): Prostate Specific Ag, Serum: <0.1 ng/mL (ref 0.0–4.0)

## 2022-10-24 NOTE — Telephone Encounter (Signed)
-----   Message from Wyatt Portela, MD sent at 10/24/2022  9:36 AM EST ----- Please let him know his PSA is still low

## 2022-10-24 NOTE — Telephone Encounter (Signed)
PC to patient, informed him his PSA is <0.1, he verbalizes understanding.

## 2022-11-02 ENCOUNTER — Other Ambulatory Visit: Payer: Self-pay | Admitting: Oncology

## 2022-11-02 DIAGNOSIS — C61 Malignant neoplasm of prostate: Secondary | ICD-10-CM

## 2022-11-19 ENCOUNTER — Ambulatory Visit (HOSPITAL_COMMUNITY): Payer: BC Managed Care – PPO

## 2022-11-26 ENCOUNTER — Ambulatory Visit (HOSPITAL_COMMUNITY)
Admission: RE | Admit: 2022-11-26 | Discharge: 2022-11-26 | Disposition: A | Discharge status: Home / Self Care | Payer: BC Managed Care – PPO | Source: Ambulatory Visit | Attending: Oncology | Admitting: Oncology

## 2022-11-26 DIAGNOSIS — C61 Malignant neoplasm of prostate: Secondary | ICD-10-CM

## 2022-11-26 MED ORDER — GADOBUTROL 1 MMOL/ML IV SOLN
7.5000 mL | Freq: Once | INTRAVENOUS | Status: AC | PRN
  Administered 2022-11-26: 7.5 mL via INTRAVENOUS

## 2022-11-26 MED ORDER — GADOBUTROL 1 MMOL/ML IV SOLN: 8.0000 mL | Freq: Once | INTRAVENOUS | Status: DC | PRN

## 2022-11-27 ENCOUNTER — Other Ambulatory Visit: Payer: Self-pay | Admitting: Oncology

## 2022-11-27 ENCOUNTER — Telehealth: Payer: Self-pay | Admitting: *Deleted

## 2022-11-27 DIAGNOSIS — C61 Malignant neoplasm of prostate: Secondary | ICD-10-CM

## 2022-11-27 NOTE — Telephone Encounter (Signed)
-----  Message from Wyatt Portela, MD sent at 11/27/2022  9:59 AM EST ----- Please let him know that I will refer him to radiation based on the MRI. There is one area that could benefit from radiation and can help the pain

## 2022-11-27 NOTE — Telephone Encounter (Signed)
Notified of message below

## 2022-11-28 NOTE — Progress Notes (Signed)
Histology and Location of Primary Cancer: Castration-sensitive advanced prostate cancer with disease to the bone diagnosed in 2019.   Sites of Visceral and Bony Metastatic Disease: T8-9  Location(s) of Symptomatic Metastases: Thoracic spine  11/26/2022 Dr. Alen Blew MR Thoracic Spine with/without Contrast CLINICAL DATA:  Metastatic disease evaluation  IMPRESSION: 1. Mild marrow heterogeneity in the posterior aspect of the inferior endplate of T8 and throughout the T8 vertebral body without focal enhancement. No epidural or paraspinal component. Sclerosis and expansion of multiple ribs without abnormal enhancement. 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 2. Central disc protrusions at T7-8 and T8-9 resulting in mild flattening of the ventral cord. 3. Multilevel lumbar spondylosis without spinal canal stenosis, as described.  11/26/2022 Dr. Alen Blew MR Lumbar Spine with/without Contrast CLINICAL DATA:  Metastatic disease evaluation.  IMPRESSION: 1. Mild marrow heterogeneity in the posterior aspect of the inferior endplate of T8 and throughout the T8 vertebral body without focal enhancement. No epidural or paraspinal component. Sclerosis and expansion of multiple ribs without abnormal enhancement. 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 2. Central disc protrusions at T7-8 and T8-9 resulting in mild flattening of the ventral cord. 3. Multilevel lumbar spondylosis without spinal canal stenosis, as described.   Past/Anticipated chemotherapy by medical oncology, if any:   Dr. Alen Blew   Pain on a scale of 0-10 is:  0/10   If Spine Met(s), symptoms, if any, include: Bowel/Bladder retention or incontinence (please describe):  No Numbness or weakness in extremities (please describe): No Current Decadron regimen, if applicable: No  Ambulatory status? Walker? Wheelchair?: Independent without devices.  SAFETY  ISSUES: Prior radiation? Yes, Prostate (completed 2019), Received 45 Gy in 25 fractions followed by 30 Gy in 15 fractions. Pacemaker/ICD? No Possible current pregnancy? Male Is the patient on methotrexate? No  Current Complaints / other details:

## 2022-12-02 NOTE — Progress Notes (Signed)
Radiation Oncology         [phone] ________________________________  Initial outpatient Consultation  Name: Dan Farrell MRN: [id number]  Date of Service: 12/03/2022 DOB: [birth year]  CC:[name], MD  Wyatt Portela, MD   REFERRING PHYSICIAN: Wyatt Portela, MD  DIAGNOSIS: 62 y.o. 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    ICD-10-CM   1. Secondary malignant neoplasm of bone and bone marrow (HCC)  C79.51    C79.52       HISTORY OF PRESENT ILLNESS: Dan Farrell is a 62 y.o. male seen at the request of Dr. Alen Blew. We previously saw the patient in 2019 for definitive radiation therapy to the prostate in combination with ADT and Zytiga. In summary, he was diagnosed with Gleason 4+4 prostate cancer on 03/24/18 by Dr. Louis Meckel with a PSA of 11. Staging CT pelvis and bone scan on 04/14/18 revealed two suspicious iliac lymph nodes and evidence of osseous metastatic disease involving left pubic ramus, left femur, and possibly three ribs, T6 vertebra, and left scapula. He was referred to Dr. Alen Blew and started on ADT with Lupron and Zytiga with prednisone on 05/19/18. We met the patient in 05/2018, and he received concurrent radiation from 07/28/18 through 09/22/18. He was also started on Xgeva on 10/02/18.   He continues on ADT, Zytiga, and Xgeva, and his PSA has remained undetectable on therapy. He has developed some mid back pain, and Dr. Alen Blew ordered MRI to rule out metastatic disease. The patient underwent thoracic and lumbar spine MRI on 11/26/22 showing suspicion for sclerotic metastatic disease to T8 vertebral body and multiple ribs.  The patient has been kindly referred to [place] today for discussion of potential radiation therapy to the painful sclerotic lesion at T8.  Today he reports that the pain has since gone away since his MRI on 11/26/22. He takes nothing for pain. The pain originated at the right, mid-thoracic region and migrated to  the left side. The pain was worsened with movement. He denies numbness, tingling, weakness, or incontinence.   PREVIOUS RADIATION THERAPY: Yes  07/28/18 - 09/22/18: The prostate was treated to an initial dose of 45 Gy in 25 fractions of 1.8 Gy, followed by a boost of 30 Gy in 15 fractions of 2 Gy, for a total of 75 Gy.   PAST MEDICAL HISTORY:  Past Medical History:  Diagnosis Date   Hyperlipemia    Hypertension    Prostate cancer ([place]) 03/24/2018   prostate cancer with bony mets      PAST SURGICAL HISTORY: Past Surgical History:  Procedure Laterality Date   PROSTATE BIOPSY     TONSILLECTOMY      FAMILY HISTORY:  Family History  Problem Relation Age of Onset   Seizures Brother    Aneurysm Brother    Stroke Brother     SOCIAL HISTORY:  Social History   Socioeconomic History   Marital status: Married    Spouse name: Not on file   Number of children: 2   Years of education: Not on file   Highest education level: Not on file  Occupational History   Not on file  Tobacco Use   Smoking status: Every Day    Packs/day: 0.50    Years: 36.00    Total pack years: 18.00    Types: Cigarettes   Smokeless tobacco: Never  Vaping Use   Vaping Use: Never used  Substance and Sexual Activity   Alcohol use: Never  Drug use: Never   Sexual activity: Not Currently  Other Topics Concern   Not on file  Social History Narrative   Marred with one son and one daughter.    Social Determinants of Health   Financial Resource Strain: Not on file  Food Insecurity: Unknown (12/03/2022)   Hunger Vital Sign    Worried About Running Out of Food in the Last Year: Patient refused    [place] in the Last Year: Patient refused  Transportation Needs: Unknown (12/03/2022)   PRAPARE - Hydrologist (Medical): Patient refused    Lack of Transportation (Non-Medical): Patient refused  Physical Activity: Not on file  Stress: Not on file  Social Connections: Not on  file  Intimate Partner Violence: Unknown (12/03/2022)   Humiliation, Afraid, Rape, and Kick questionnaire    Fear of Current or Ex-Partner: Patient refused    Emotionally Abused: Patient refused    Physically Abused: Patient refused    Sexually Abused: Patient refused    ALLERGIES: Lexapro [escitalopram], Zoloft [sertraline], and Lisinopril  MEDICATIONS:  Current Outpatient Medications  Medication Sig Dispense Refill   abiraterone acetate (ZYTIGA) 250 MG tablet TAKE 4 TABLETS BY MOUTH 1 TIME A DAY ON AN EMPTY STOMACH 1 HOUR BEFORE OR 2 HOURS AFTER A MEAL 120 tablet 0   Calcium Carb-Cholecalciferol (OS-CAL CALCIUM + D3) 500-200 MG-UNIT TABS Os-Cal + D3     celecoxib (CELEBREX) 50 MG capsule Take 1 capsule (50 mg total) by mouth daily. 4 capsule 0   docusate sodium (COLACE) 100 MG capsule Take 100 mg by mouth 2 (two) times daily.     fenofibrate 160 MG tablet TAKE 1 TABLET BY MOUTH ONCE A DAY FOR CHOLESTEROL  3   leuprolide (LUPRON DEPOT, 19-MONTH,) 30 MG injection as directed Intramuscular     Multiple Vitamin (MULTI-VITAMIN DAILY PO) 1 tablet     Naproxen Sodium (ALEVE PO) Aleve     predniSONE (DELTASONE) 5 MG tablet TAKE 1 TABLET BY MOUTH EVERY DAY WITH BREAKFAST 90 tablet 3   valsartan-hydrochlorothiazide (DIOVAN-HCT) 160-12.5 MG tablet Take 1 tablet by mouth daily.  0   No current facility-administered medications for this encounter.    REVIEW OF SYSTEMS:  As per HPI    PHYSICAL EXAM:  Wt Readings from Last 3 Encounters:  12/03/22 175 lb (79.4 kg)  10/23/22 172 lb 3.2 oz (78.1 kg)  06/28/22 177 lb 8 oz (80.5 kg)   Temp Readings from Last 3 Encounters:  12/03/22 97.8 F (36.6 C)  10/23/22 (!) 97.5 F (36.4 C) (Temporal)  06/28/22 (!) 97.4 F (36.3 C) (Tympanic)   BP Readings from Last 3 Encounters:  12/03/22 (!) 150/83  10/23/22 (!) 153/83  06/28/22 138/73   Pulse Readings from Last 3 Encounters:  12/03/22 85  10/23/22 84  06/28/22 79   Pain Assessment Pain  Score: 0-No pain/10  In general this is a well appearing man in no acute distress. He's alert and oriented x4 and appropriate throughout the examination. Cardiopulmonary assessment is negative for acute distress and he exhibits normal effort.     KPS = 100  100 - Normal; no complaints; no evidence of disease. 90   - Able to carry on normal activity; minor signs or symptoms of disease. 80   - Normal activity with effort; some signs or symptoms of disease. 39   - Cares for self; unable to carry on normal activity or to do active work. 60   -  Requires occasional assistance, but is able to care for most of his personal needs. 50   - Requires considerable assistance and frequent medical care. 35   - Disabled; requires special care and assistance. 64   - Severely disabled; hospital admission is indicated although death not imminent. 8   - Very sick; hospital admission necessary; active supportive treatment necessary. 10   - Moribund; fatal processes progressing rapidly. 0     - Dead  Karnofsky DA, Abelmann [place], Craver LS and Burchenal JH [phone]) The use of the nitrogen mustards in the palliative treatment of carcinoma: with particular reference to bronchogenic carcinoma Cancer 1 634-56  LABORATORY DATA:  Lab Results  Component Value Date   WBC 4.5 10/23/2022   HGB 12.1 (L) 10/23/2022   HCT 34.6 (L) 10/23/2022   MCV 92.3 10/23/2022   PLT 223 10/23/2022   Lab Results  Component Value Date   NA 137 10/23/2022   K 4.0 10/23/2022   CL 102 10/23/2022   CO2 28 10/23/2022   Lab Results  Component Value Date   ALT 11 10/23/2022   AST 16 10/23/2022   ALKPHOS 35 (L) 10/23/2022   BILITOT 0.6 10/23/2022     RADIOGRAPHY: MR THORACIC SPINE W WO CONTRAST  Result Date: 11/27/2022 CLINICAL DATA:  Metastatic disease evaluation. EXAM: MRI THORACIC AND LUMBAR SPINE WITHOUT AND WITH CONTRAST TECHNIQUE: Multiplanar and multiecho pulse sequences of the thoracic and lumbar spine were obtained without  and with intravenous contrast. CONTRAST:  7.21m GADAVIST GADOBUTROL 1 MMOL/ML IV SOLN COMPARISON:  NM Bone scan 03/17/2019. FINDINGS: MRI THORACIC SPINE FINDINGS Alignment:  Normal. Vertebrae: Mild marrow heterogeneity in the posterior aspect of the inferior endplate of T7 and throughout the T8 vertebral body without focal enhancement. No abnormal marrow enhancement. Expansion and increased sclerosis of the right fifth, sixth, seventh, and left eighth and twelfth ribs without abnormal enhancement. Cord:  No abnormal cord signal or enhancement. Paraspinal and other soft tissues: No paraspinal mass. Visualized chest soft tissues are unremarkable. Disc levels: Central disc protrusion at T7-8 results in mild flattening of the ventral cord. No spinal canal stenosis or neural foraminal narrowing. Small left central disc protrusion at T8-9 with mild flattening of the left ventral cord. No spinal canal stenosis or neural foraminal narrowing. MRI LUMBAR SPINE FINDINGS Segmentation: Conventional numbering with 5 non-rib-bearing, lumbar type vertebral bodies. Alignment:  Normal. Vertebrae: Modic type 1 degenerative endplate marrow signal changes at L5-S1. No suspicious marrow signal or enhancement. Conus medullaris: Extends to the L1 level and appears normal. No abnormal enhancement of the conus or cauda equina nerve roots. Paraspinal and other soft tissues: No paraspinal masses. Retroperitoneal soft tissues are unremarkable. Disc levels: Left eccentric disc bulge and facet arthropathy result in compression of the traversing left L5 nerve root in the lateral recess at L4-5. Disc height loss, bulge, endplate osteophytes and bilateral facet arthropathy contributes to severe bilateral neural foraminal stenosis at L5-S1. IMPRESSION: 1. Mild marrow heterogeneity in the posterior aspect of the inferior endplate of T8 and throughout the T8 vertebral body without focal enhancement. No epidural or paraspinal component. Sclerosis and  expansion of multiple ribs without abnormal enhancement. 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 2. Central disc protrusions at T7-8 and T8-9 resulting in mild flattening of the ventral cord. 3. Multilevel lumbar spondylosis without spinal canal stenosis, as described. Electronically Signed   By: WEmmit AlexandersM.D   On: 11/27/2022 09:05   MR Lumbar Spine W WLottie Dawson  Contrast  Result Date: 11/27/2022 CLINICAL DATA:  Metastatic disease evaluation. EXAM: MRI THORACIC AND LUMBAR SPINE WITHOUT AND WITH CONTRAST TECHNIQUE: Multiplanar and multiecho pulse sequences of the thoracic and lumbar spine were obtained without and with intravenous contrast. CONTRAST:  7.16m GADAVIST GADOBUTROL 1 MMOL/ML IV SOLN COMPARISON:  NM Bone scan 03/17/2019. FINDINGS: MRI THORACIC SPINE FINDINGS Alignment:  Normal. Vertebrae: Mild marrow heterogeneity in the posterior aspect of the inferior endplate of T7 and throughout the T8 vertebral body without focal enhancement. No abnormal marrow enhancement. Expansion and increased sclerosis of the right fifth, sixth, seventh, and left eighth and twelfth ribs without abnormal enhancement. Cord:  No abnormal cord signal or enhancement. Paraspinal and other soft tissues: No paraspinal mass. Visualized chest soft tissues are unremarkable. Disc levels: Central disc protrusion at T7-8 results in mild flattening of the ventral cord. No spinal canal stenosis or neural foraminal narrowing. Small left central disc protrusion at T8-9 with mild flattening of the left ventral cord. No spinal canal stenosis or neural foraminal narrowing. MRI LUMBAR SPINE FINDINGS Segmentation: Conventional numbering with 5 non-rib-bearing, lumbar type vertebral bodies. Alignment:  Normal. Vertebrae: Modic type 1 degenerative endplate marrow signal changes at L5-S1. No suspicious marrow signal or enhancement. Conus medullaris: Extends to the L1 level and appears normal. No abnormal  enhancement of the conus or cauda equina nerve roots. Paraspinal and other soft tissues: No paraspinal masses. Retroperitoneal soft tissues are unremarkable. Disc levels: Left eccentric disc bulge and facet arthropathy result in compression of the traversing left L5 nerve root in the lateral recess at L4-5. Disc height loss, bulge, endplate osteophytes and bilateral facet arthropathy contributes to severe bilateral neural foraminal stenosis at L5-S1. IMPRESSION: 1. Mild marrow heterogeneity in the posterior aspect of the inferior endplate of T8 and throughout the T8 vertebral body without focal enhancement. No epidural or paraspinal component. Sclerosis and expansion of multiple ribs without abnormal enhancement. 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 2. Central disc protrusions at T7-8 and T8-9 resulting in mild flattening of the ventral cord. 3. Multilevel lumbar spondylosis without spinal canal stenosis, as described. Electronically Signed   By: WEmmit AlexandersM.D   On: 11/27/2022 09:05      IMPRESSION/PLAN: 190 62y.o. 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  MRI imaging is suspicious for metastatic disease from his prostate cancer that coincide with patient's presenting pain. Of note, vertebrae were not labeled on MRI. I have reached out to radiology to label the imaging and will re-review patient's MRI after this is completed. Because the patient is not experiencing any pain at this time.  For this reason, radiation therapy is not necessarily indicated at this time.  Today we had a very productive conversation with the patient helping to set expectations around how and when palliative radiation could be helpful.  However, if the patient begins to experience pain again we are happy to re-evaluate for possible radiation therapy.   Today, we talked to the patient and family about the findings and workup thus far. We discussed  the natural history of prostate cancer and general treatment, highlighting the role of radiotherapy in the management of painful bony metastasis. We emphasized that radiation therapy is not typically done for bone metastasis that are not painful. We discussed the available radiation techniques, and focused on the details and logistics of delivery. We reviewed the anticipated acute and late sequelae associated with radiation in this setting. The patient was encouraged  to ask questions that were answered to his satisfaction.   At the end of our conversation, the patient would like to opt out of radiation therapy at this time. The patient appears to have a good understanding of his disease and our treatment recommendations which are to continue to monitor for further disease progression. His PSA levels will continue to be monitored. He understands to call back with any new or worsening pain. We appreciate the referral and are willing to re-evaluate at any time.  Since we laid the groundwork to consider radiation, if the patient's back pain returns, we will be able to quickly initiate treatment without delay.   We personally spent 60 minutes in this encounter including chart review, reviewing radiological studies, meeting face-to-face with the patient, entering orders and completing documentation.     Leona Singleton, PA-C    Tyler Pita, MD  [facility] Direct Dial: [phone]  Fax: [phone] [email or web address]  Skype  [email or web address]   This document serves as a record of services personally performed by Tyler Pita, MD and Leona Singleton, PA-C. It was created on their behalf by Wilburn Mylar, a trained medical scribe. The creation of this record is based on the scribe's personal observations and the provider's statements to them. This document has been checked and approved by the attending provider.

## 2022-12-03 ENCOUNTER — Other Ambulatory Visit: Payer: Self-pay

## 2022-12-03 ENCOUNTER — Ambulatory Visit
Admission: RE | Admit: 2022-12-03 | Discharge: 2022-12-03 | Disposition: A | Discharge status: Home / Self Care | Payer: BC Managed Care – PPO | Source: Ambulatory Visit | Attending: Radiation Oncology | Admitting: Radiation Oncology

## 2022-12-03 VITALS — BP 150/83 | HR 85 | Temp 97.8°F | Resp 18 | Ht 68.0 in | Wt 175.0 lb

## 2022-12-03 DIAGNOSIS — Z79899 Other long term (current) drug therapy: Secondary | ICD-10-CM | POA: Insufficient documentation

## 2022-12-03 DIAGNOSIS — C61 Malignant neoplasm of prostate: Secondary | ICD-10-CM | POA: Diagnosis not present

## 2022-12-03 DIAGNOSIS — I10 Essential (primary) hypertension: Secondary | ICD-10-CM | POA: Insufficient documentation

## 2022-12-03 DIAGNOSIS — E785 Hyperlipidemia, unspecified: Secondary | ICD-10-CM | POA: Insufficient documentation

## 2022-12-03 DIAGNOSIS — Z791 Long term (current) use of non-steroidal anti-inflammatories (NSAID): Secondary | ICD-10-CM | POA: Diagnosis not present

## 2022-12-03 DIAGNOSIS — C7951 Secondary malignant neoplasm of bone: Secondary | ICD-10-CM | POA: Diagnosis not present

## 2022-12-03 DIAGNOSIS — F17210 Nicotine dependence, cigarettes, uncomplicated: Secondary | ICD-10-CM | POA: Diagnosis not present

## 2022-12-07 ENCOUNTER — Other Ambulatory Visit: Payer: Self-pay | Admitting: Oncology

## 2022-12-07 DIAGNOSIS — C61 Malignant neoplasm of prostate: Secondary | ICD-10-CM

## 2023-01-07 ENCOUNTER — Other Ambulatory Visit: Payer: Self-pay

## 2023-01-07 DIAGNOSIS — C61 Malignant neoplasm of prostate: Secondary | ICD-10-CM

## 2023-01-09 ENCOUNTER — Other Ambulatory Visit: Payer: Self-pay | Admitting: Hematology and Oncology

## 2023-01-09 ENCOUNTER — Other Ambulatory Visit: Payer: Self-pay | Admitting: *Deleted

## 2023-01-09 DIAGNOSIS — C61 Malignant neoplasm of prostate: Secondary | ICD-10-CM

## 2023-01-09 MED ORDER — ABIRATERONE ACETATE 250 MG PO TABS: ORAL_TABLET | ORAL | 0 refills | Status: DC

## 2023-01-09 NOTE — Progress Notes (Signed)
Zytiga refilled.

## 2023-01-10 ENCOUNTER — Encounter: Payer: Self-pay | Admitting: Hematology and Oncology

## 2023-01-10 ENCOUNTER — Other Ambulatory Visit: Payer: Self-pay | Admitting: *Deleted

## 2023-01-10 DIAGNOSIS — C61 Malignant neoplasm of prostate: Secondary | ICD-10-CM

## 2023-01-10 MED ORDER — ABIRATERONE ACETATE 250 MG PO TABS: ORAL_TABLET | ORAL | 0 refills | Status: DC

## 2023-02-12 ENCOUNTER — Other Ambulatory Visit: Payer: Self-pay | Admitting: *Deleted

## 2023-02-12 DIAGNOSIS — C61 Malignant neoplasm of prostate: Secondary | ICD-10-CM

## 2023-02-12 MED ORDER — ABIRATERONE ACETATE 250 MG PO TABS: ORAL_TABLET | ORAL | 0 refills | Status: DC

## 2023-02-18 ENCOUNTER — Other Ambulatory Visit: Payer: Self-pay | Admitting: *Deleted

## 2023-02-18 DIAGNOSIS — C61 Malignant neoplasm of prostate: Secondary | ICD-10-CM

## 2023-02-19 ENCOUNTER — Inpatient Hospital Stay: Payer: BC Managed Care – PPO

## 2023-02-19 ENCOUNTER — Encounter: Payer: Self-pay | Admitting: Hematology and Oncology

## 2023-02-19 ENCOUNTER — Inpatient Hospital Stay: Payer: BC Managed Care – PPO | Admitting: Hematology and Oncology

## 2023-02-19 ENCOUNTER — Other Ambulatory Visit: Payer: Self-pay

## 2023-02-19 ENCOUNTER — Inpatient Hospital Stay: Payer: BC Managed Care – PPO | Attending: Nurse Practitioner

## 2023-02-19 VITALS — BP 146/79 | HR 78 | Temp 97.9°F | Resp 16 | Ht 68.0 in | Wt 173.9 lb

## 2023-02-19 DIAGNOSIS — Z923 Personal history of irradiation: Secondary | ICD-10-CM | POA: Diagnosis not present

## 2023-02-19 DIAGNOSIS — Z79899 Other long term (current) drug therapy: Secondary | ICD-10-CM | POA: Insufficient documentation

## 2023-02-19 DIAGNOSIS — C61 Malignant neoplasm of prostate: Secondary | ICD-10-CM | POA: Insufficient documentation

## 2023-02-19 LAB — CBC WITH DIFFERENTIAL (CANCER CENTER ONLY)
Abs Immature Granulocytes: 0.02 10*3/uL (ref 0.00–0.07)
Basophils Absolute: 0 10*3/uL (ref 0.0–0.1)
Basophils Relative: 1 %
Eosinophils Absolute: 0.1 10*3/uL (ref 0.0–0.5)
Eosinophils Relative: 2 %
HCT: 33.8 % — ABNORMAL LOW (ref 39.0–52.0)
Hemoglobin: 12.1 g/dL — ABNORMAL LOW (ref 13.0–17.0)
Immature Granulocytes: 0 %
Lymphocytes Relative: 17 %
Lymphs Abs: 0.8 10*3/uL (ref 0.7–4.0)
MCH: 32.4 pg (ref 26.0–34.0)
MCHC: 35.8 g/dL (ref 30.0–36.0)
MCV: 90.4 fL (ref 80.0–100.0)
Monocytes Absolute: 0.4 10*3/uL (ref 0.1–1.0)
Monocytes Relative: 9 %
Neutro Abs: 3.2 10*3/uL (ref 1.7–7.7)
Neutrophils Relative %: 71 %
Platelet Count: 254 10*3/uL (ref 150–400)
RBC: 3.74 MIL/uL — ABNORMAL LOW (ref 4.22–5.81)
RDW: 13.3 % (ref 11.5–15.5)
WBC Count: 4.6 10*3/uL (ref 4.0–10.5)
nRBC: 0 % (ref 0.0–0.2)

## 2023-02-19 LAB — CMP (CANCER CENTER ONLY)
ALT: 12 U/L (ref 0–44)
AST: 15 U/L (ref 15–41)
Albumin: 4.3 g/dL (ref 3.5–5.0)
Alkaline Phosphatase: 43 U/L (ref 38–126)
Anion gap: 8 (ref 5–15)
BUN: 14 mg/dL (ref 6–20)
CO2: 27 mmol/L (ref 22–32)
Calcium: 9.8 mg/dL (ref 8.9–10.3)
Chloride: 101 mmol/L (ref 98–111)
Creatinine: 0.98 mg/dL (ref 0.61–1.24)
GFR, Estimated: >60 mL/min (ref >60)
Glucose, Bld: 90 mg/dL (ref 70–99)
Potassium: 4 mmol/L (ref 3.5–5.1)
Sodium: 136 mmol/L (ref 135–145)
Total Bilirubin: 0.4 mg/dL (ref 0.3–1.2)
Total Protein: 7.5 g/dL (ref 6.5–8.1)

## 2023-02-19 MED ORDER — DENOSUMAB 120 MG/1.7ML ~~LOC~~ SOLN
120.0000 mg | Freq: Once | SUBCUTANEOUS | Status: AC
  Administered 2023-02-19: 120 mg via SUBCUTANEOUS
  Filled 2023-02-19: qty 1.7

## 2023-02-19 MED ORDER — LEUPROLIDE ACETATE (4 MONTH) 30 MG ~~LOC~~ KIT
30.0000 mg | PACK | Freq: Once | SUBCUTANEOUS | Status: AC
  Administered 2023-02-19: 30 mg via SUBCUTANEOUS
  Filled 2023-02-19: qty 30

## 2023-02-19 NOTE — Progress Notes (Signed)
Hematology and Oncology Follow Up Visit  Dan Farrell [id number] 02/18/1961 62 y.o. 02/19/2023 1:25 PM Dan Farrell, Dan Farrell, Dan Maduro, MD   Principle Diagnosis: 62 year old man with castration-sensitive advanced prostate cancer with disease to the bone diagnosed in 2019.  He presented with PSA of 11 and Gleason score 4+4 = 8.   Prior Therapy:  Prostate biopsy obtained in May 2019.  Radiation therapy to the prostate completed and November 2019.  He received 45 Gy in 25 fractions followed by 30 Gy in 15 fractions  Current therapy:  Eligard 30 mg every 4 months.  Next injection will be received on 10/23/2022.  Zytiga 1000 mg with prednisone 5 mg started in July 2019  Xgeva 120 mg every 4 months.  This will be given again today and repeated in 4 months.   Interim History: Dan Farrell presents today for a follow-up visit.  Since last visit, he reports no major changes in his health.  His back pain has resolved completely. He is getting eligard and xgeva every 4 months.  He is taking zytiga as instructed. No complaints at all. Rest of the pertinent 10 point ROS reviewed and neg.   Medications: Reviewed without changes. Current Outpatient Medications  Medication Sig Dispense Refill   abiraterone acetate (ZYTIGA) 250 MG tablet Take 4 tablets (1000mg ) once a day on an empty stomach 1 hour before or 2 hours after a meal 120 tablet 0   Calcium Carb-Cholecalciferol (OS-CAL CALCIUM + D3) 500-200 MG-UNIT TABS Os-Cal + D3     celecoxib (CELEBREX) 50 MG capsule Take 1 capsule (50 mg total) by mouth daily. 4 capsule 0   docusate sodium (COLACE) 100 MG capsule Take 100 mg by mouth 2 (two) times daily.     fenofibrate 160 MG tablet TAKE 1 TABLET BY MOUTH ONCE A DAY FOR CHOLESTEROL  3   leuprolide (LUPRON DEPOT, 21-MONTH,) 30 MG injection as directed Intramuscular     Multiple Vitamin (MULTI-VITAMIN DAILY PO) 1 tablet     Naproxen Sodium (ALEVE PO) Aleve     predniSONE (DELTASONE) 5 MG  tablet TAKE 1 TABLET BY MOUTH EVERY DAY WITH BREAKFAST 90 tablet 3   valsartan-hydrochlorothiazide (DIOVAN-HCT) 160-12.5 MG tablet Take 1 tablet by mouth daily.  0   No current facility-administered medications for this visit.     Allergies:  Allergies  Allergen Reactions   Lexapro [Escitalopram]     Other reaction(s): Sexual side effects   Zoloft [Sertraline]     Other reaction(s): Jaw clenching   Lisinopril Rash    Physical Exam:   Blood pressure (!) 146/79, pulse 78, temperature 97.9 F (36.6 C), temperature source Temporal, resp. rate 16, height 5\' 8"  (1.727 m), weight 173 lb 14.4 oz (78.9 kg), SpO2 98 %.  General appearance: Alert, awake without any distress. Head: Atraumatic without abnormalities Oropharynx: Without any thrush or ulcers. Eyes: No scleral icterus. Lymph nodes: No lymphadenopathy noted in the cervical, supraclavicular, or axillary nodes Heart:regular rate and rhythm, without any murmurs or gallops.   Lung: Clear to auscultation without any rhonchi, wheezes or dullness to percussion. Abdomin: Soft, nontender without any shifting dullness or ascites. Musculoskeletal: No clubbing or cyanosis. Neurological: No motor or sensory deficits. Skin: No rashes or lesions  CBC    Component Value Date/Time   WBC 4.6 02/19/2023 1258   RBC 3.74 (L) 02/19/2023 1258   HGB 12.1 (L) 02/19/2023 1258   HCT 33.8 (L) 02/19/2023 1258   PLT 254 02/19/2023 1258   MCV 90.4  02/19/2023 1258   MCH 32.4 02/19/2023 1258   MCHC 35.8 02/19/2023 1258   RDW 13.3 02/19/2023 1258   LYMPHSABS 0.8 02/19/2023 1258   MONOABS 0.4 02/19/2023 1258   EOSABS 0.1 02/19/2023 1258   BASOSABS 0.0 02/19/2023 1258      Latest Reference Range & Units 03/01/22 14:23 06/28/22 09:27  Prostate Specific Ag, Serum 0.0 - 4.0 ng/mL <0.1 <0.1   Impression and Plan:    62 year old man with:   1.   Castration-sensitive advanced prostate cancer with disease to the bone diagnosed in 2019.  The  natural course of his disease was discussed at this time and treatment choices were reviewed.  No concerns on ROS or PE findings.  Last PSA undetectable. Continue zytiga, eligard and xgeva.   2.  Androgen deprivation: Will proceed with Eligard today and continued indefinitely.  Complications including weight gain, hot flashes among others were discussed.  3.  Bone directed therapy: He is currently on Xgeva without any issues.  Complications that include osteonecrosis of the jaw and hypocalcemia were discussed. No new dental concerns reported.  4.  Liver function and electrolyte surveillance: No issues reported at this time.  Will continue to monitor   5.  Back pain: Resolved.  6.  Follow-up: He will return in 4 months for a follow-up.   30 minutes were dedicated to this visit.  The time was spent on reviewing laboratory data, disease status update and outlining future plan of care discussion.   Rachel Moulds, MD 4/9/20241:25 PM

## 2023-02-20 LAB — PROSTATE-SPECIFIC AG, SERUM (LABCORP): Prostate Specific Ag, Serum: <0.1 ng/mL (ref 0.0–4.0)

## 2023-03-14 ENCOUNTER — Other Ambulatory Visit: Payer: Self-pay | Admitting: *Deleted

## 2023-03-14 DIAGNOSIS — C61 Malignant neoplasm of prostate: Secondary | ICD-10-CM

## 2023-03-14 MED ORDER — PREDNISONE 5 MG PO TABS: ORAL_TABLET | ORAL | 3 refills | Status: DC

## 2023-03-14 MED ORDER — ABIRATERONE ACETATE 250 MG PO TABS: ORAL_TABLET | ORAL | 6 refills | Status: DC

## 2023-06-21 ENCOUNTER — Other Ambulatory Visit: Payer: Self-pay

## 2023-06-21 ENCOUNTER — Inpatient Hospital Stay: Payer: BC Managed Care – PPO

## 2023-06-21 ENCOUNTER — Inpatient Hospital Stay (HOSPITAL_BASED_OUTPATIENT_CLINIC_OR_DEPARTMENT_OTHER): Payer: BC Managed Care – PPO | Admitting: Adult Health

## 2023-06-21 ENCOUNTER — Inpatient Hospital Stay: Payer: BC Managed Care – PPO | Attending: Adult Health

## 2023-06-21 ENCOUNTER — Other Ambulatory Visit: Payer: Self-pay | Admitting: *Deleted

## 2023-06-21 ENCOUNTER — Encounter: Payer: Self-pay | Admitting: Adult Health

## 2023-06-21 VITALS — BP 131/68 | HR 81 | Temp 97.9°F | Resp 18 | Ht 68.0 in | Wt 169.8 lb

## 2023-06-21 DIAGNOSIS — Z79899 Other long term (current) drug therapy: Secondary | ICD-10-CM | POA: Diagnosis not present

## 2023-06-21 DIAGNOSIS — E785 Hyperlipidemia, unspecified: Secondary | ICD-10-CM | POA: Diagnosis not present

## 2023-06-21 DIAGNOSIS — C7951 Secondary malignant neoplasm of bone: Secondary | ICD-10-CM | POA: Diagnosis not present

## 2023-06-21 DIAGNOSIS — Z8546 Personal history of malignant neoplasm of prostate: Secondary | ICD-10-CM | POA: Diagnosis not present

## 2023-06-21 DIAGNOSIS — F17210 Nicotine dependence, cigarettes, uncomplicated: Secondary | ICD-10-CM | POA: Diagnosis not present

## 2023-06-21 DIAGNOSIS — C61 Malignant neoplasm of prostate: Secondary | ICD-10-CM | POA: Insufficient documentation

## 2023-06-21 DIAGNOSIS — I10 Essential (primary) hypertension: Secondary | ICD-10-CM | POA: Insufficient documentation

## 2023-06-21 LAB — CBC WITH DIFFERENTIAL (CANCER CENTER ONLY)
Abs Immature Granulocytes: 0.02 10*3/uL (ref 0.00–0.07)
Basophils Absolute: 0 10*3/uL (ref 0.0–0.1)
Basophils Relative: 1 %
Eosinophils Absolute: 0.1 10*3/uL (ref 0.0–0.5)
Eosinophils Relative: 1 %
HCT: 31.7 % — ABNORMAL LOW (ref 39.0–52.0)
Hemoglobin: 11.2 g/dL — ABNORMAL LOW (ref 13.0–17.0)
Immature Granulocytes: 0 %
Lymphocytes Relative: 19 %
Lymphs Abs: 0.9 10*3/uL (ref 0.7–4.0)
MCH: 31.5 pg (ref 26.0–34.0)
MCHC: 35.3 g/dL (ref 30.0–36.0)
MCV: 89.3 fL (ref 80.0–100.0)
Monocytes Absolute: 0.4 10*3/uL (ref 0.1–1.0)
Monocytes Relative: 9 %
Neutro Abs: 3.4 10*3/uL (ref 1.7–7.7)
Neutrophils Relative %: 70 %
Platelet Count: 262 10*3/uL (ref 150–400)
RBC: 3.55 MIL/uL — ABNORMAL LOW (ref 4.22–5.81)
RDW: 13.2 % (ref 11.5–15.5)
WBC Count: 4.9 10*3/uL (ref 4.0–10.5)
nRBC: 0 % (ref 0.0–0.2)

## 2023-06-21 LAB — CMP (CANCER CENTER ONLY)
ALT: 12 U/L (ref 0–44)
AST: 16 U/L (ref 15–41)
Albumin: 4.1 g/dL (ref 3.5–5.0)
Alkaline Phosphatase: 43 U/L (ref 38–126)
Anion gap: 9 (ref 5–15)
BUN: 18 mg/dL (ref 8–23)
CO2: 24 mmol/L (ref 22–32)
Calcium: 9.2 mg/dL (ref 8.9–10.3)
Chloride: 99 mmol/L (ref 98–111)
Creatinine: 1.02 mg/dL (ref 0.61–1.24)
GFR, Estimated: >60 mL/min (ref >60)
Glucose, Bld: 88 mg/dL (ref 70–99)
Potassium: 3.6 mmol/L (ref 3.5–5.1)
Sodium: 132 mmol/L — ABNORMAL LOW (ref 135–145)
Total Bilirubin: 0.6 mg/dL (ref 0.3–1.2)
Total Protein: 6.9 g/dL (ref 6.5–8.1)

## 2023-06-21 MED ORDER — DENOSUMAB 120 MG/1.7ML ~~LOC~~ SOLN
120.0000 mg | Freq: Once | SUBCUTANEOUS | Status: AC
  Administered 2023-06-21: 120 mg via SUBCUTANEOUS
  Filled 2023-06-21: qty 1.7

## 2023-06-21 MED ORDER — LEUPROLIDE ACETATE (4 MONTH) 30 MG ~~LOC~~ KIT
30.0000 mg | PACK | Freq: Once | SUBCUTANEOUS | Status: AC
  Administered 2023-06-21: 30 mg via SUBCUTANEOUS
  Filled 2023-06-21: qty 30

## 2023-06-21 NOTE — Assessment & Plan Note (Signed)
Dan Farrell is a 61 year old man with metastatic prostate cancer currently on treatment with Eligard, Zytiga, and Xgeva with good tolerance.  He has no signs of metastatic prostate cancer progression.  He will continue on treatment as he is tolerating it well.  Continue to follow his PSA.  RTC in 4 months for labs, f/u and injection

## 2023-06-21 NOTE — Progress Notes (Signed)
[facility] Cancer Follow up:    [name], MD [address] [place] [address]   DIAGNOSIS: Prostate cancer  SUMMARY OF ONCOLOGIC HISTORY: Oncology History  Prostate cancer (HCC)  03/24/2018 Initial Biopsy   he was diagnosed with Gleason 4+4 prostate cancer by Dr. Marlou Porch with a PSA of 11.    04/14/2018 Imaging   CT pelvis and bone scan revealed two suspicious iliac lymph nodes and evidence of osseous metastatic disease involving left pubic ramus, left femur, and possibly three ribs, T6 vertebra, and left scapula.    05/19/2018 Treatment Plan Change   Eligard and Zytiga with prednisone, Xgeva added 10/02/2018   07/31/2018 - 09/22/2018 Radiation Therapy   concurrent radiation: The prostate was treated to an initial dose of 45 Gy in 25 fractions of 1.8 Gy, followed by a boost of 30 Gy in 15 fractions of 2 Gy, for a total of 75 Gy.      CURRENT THERAPY: Berneda Rose  INTERVAL HISTORY: MATIAS QURAISHI 62 y.o. male returns for follow-up and evaluation prior to receiving his every 57-month Eligard and [place]  He tells me he is tolerating this well his only side effect that he notes is increasing hot flashes the closer he gets to this injection.  He explains to me that he has baseline fatigue due to his work schedule.  Otherwise he is feeling well and has no new concerns today.   Patient Active Problem List   Diagnosis Date Noted   Prostate cancer (HCC) 05/01/2018    is allergic to lexapro [escitalopram], zoloft [sertraline], and lisinopril.  MEDICAL HISTORY: Past Medical History:  Diagnosis Date   Hyperlipemia    Hypertension    Prostate cancer (HCC) 03/24/2018   prostate cancer with bony mets    SURGICAL HISTORY: Past Surgical History:  Procedure Laterality Date   PROSTATE BIOPSY     TONSILLECTOMY      SOCIAL HISTORY: Social History   Socioeconomic History   Marital status: Married    Spouse name: Not on file   Number  of children: 2   Years of education: Not on file   Highest education level: Not on file  Occupational History   Not on file  Tobacco Use   Smoking status: Every Day    Current packs/day: 0.50    Average packs/day: 0.5 packs/day for 36.0 years (18.0 ttl pk-yrs)    Types: Cigarettes   Smokeless tobacco: Never  Vaping Use   Vaping status: Never Used  Substance and Sexual Activity   Alcohol use: Never   Drug use: Never   Sexual activity: Not Currently  Other Topics Concern   Not on file  Social History Narrative   Marred with one son and one daughter.    Social Determinants of Health   Financial Resource Strain: Not on file  Food Insecurity: Patient Declined (12/03/2022)   Hunger Vital Sign    Worried About Running Out of Food in the Last Year: Patient declined    Ran Out of Food in the Last Year: Patient declined  Transportation Needs: Patient Declined (12/03/2022)   PRAPARE - Administrator, Civil Service (Medical): Patient declined    Lack of Transportation (Non-Medical): Patient declined  Physical Activity: Not on file  Stress: Not on file  Social Connections: Not on file  Intimate Partner Violence: Patient Declined (12/03/2022)   Humiliation, Afraid, Rape, and Kick questionnaire    Fear of Current or Ex-Partner:  Patient declined    Emotionally Abused: Patient declined    Physically Abused: Patient declined    Sexually Abused: Patient declined    FAMILY HISTORY: Family History  Problem Relation Age of Onset   Seizures Brother    Aneurysm Brother    Stroke Brother     Review of Systems  Constitutional:  Positive for fatigue. Negative for appetite change, chills, fever and unexpected weight change.  HENT:   Negative for hearing loss, lump/mass and trouble swallowing.   Eyes:  Negative for eye problems and icterus.  Respiratory:  Negative for chest tightness, cough and shortness of breath.   Cardiovascular:  Negative for chest pain, leg swelling and  palpitations.  Gastrointestinal:  Negative for abdominal distention, abdominal pain, constipation, diarrhea, nausea and vomiting.  Endocrine: Negative for hot flashes.  Genitourinary:  Negative for difficulty urinating.   Musculoskeletal:  Negative for arthralgias.  Skin:  Negative for itching and rash.  Neurological:  Negative for dizziness, extremity weakness, headaches and numbness.  Hematological:  Negative for adenopathy. Does not bruise/bleed easily.  Psychiatric/Behavioral:  Negative for depression. The patient is not nervous/anxious.       PHYSICAL EXAMINATION    Vitals:   06/21/23 1434  BP: 131/68  Pulse: 81  Resp: 18  Temp: 97.9 F (36.6 C)  SpO2: 98%    Physical Exam Constitutional:      General: He is not in acute distress.    Appearance: Normal appearance. He is not toxic-appearing.  HENT:     Head: Normocephalic and atraumatic.     Mouth/Throat:     Mouth: Mucous membranes are moist.     Pharynx: Oropharynx is clear. No oropharyngeal exudate or posterior oropharyngeal erythema.  Eyes:     General: No scleral icterus. Cardiovascular:     Rate and Rhythm: Normal rate and regular rhythm.     Pulses: Normal pulses.     Heart sounds: Normal heart sounds.  Pulmonary:     Effort: Pulmonary effort is normal.     Breath sounds: Normal breath sounds.  Abdominal:     General: Abdomen is flat. Bowel sounds are normal. There is no distension.     Palpations: Abdomen is soft.     Tenderness: There is no abdominal tenderness.  Musculoskeletal:        General: No swelling.     Cervical back: Neck supple.  Lymphadenopathy:     Cervical: No cervical adenopathy.  Skin:    General: Skin is warm and dry.     Findings: No rash.  Neurological:     General: No focal deficit present.     Mental Status: He is alert.  Psychiatric:        Mood and Affect: Mood normal.        Behavior: Behavior normal.     LABORATORY DATA:  CBC    Component Value Date/Time   WBC  4.9 06/21/2023 1429   RBC 3.55 (L) 06/21/2023 1429   HGB 11.2 (L) 06/21/2023 1429   HCT 31.7 (L) 06/21/2023 1429   PLT 262 06/21/2023 1429   MCV 89.3 06/21/2023 1429   MCH 31.5 06/21/2023 1429   MCHC 35.3 06/21/2023 1429   RDW 13.2 06/21/2023 1429   LYMPHSABS 0.9 06/21/2023 1429   MONOABS 0.4 06/21/2023 1429   EOSABS 0.1 06/21/2023 1429   BASOSABS 0.0 06/21/2023 1429    CMP     Component Value Date/Time   NA 132 (L) 06/21/2023 1429   K 3.6  06/21/2023 1429   CL 99 06/21/2023 1429   CO2 24 06/21/2023 1429   GLUCOSE 88 06/21/2023 1429   BUN 18 06/21/2023 1429   CREATININE 1.02 06/21/2023 1429   CALCIUM 9.2 06/21/2023 1429   PROT 6.9 06/21/2023 1429   ALBUMIN 4.1 06/21/2023 1429   AST 16 06/21/2023 1429   ALT 12 06/21/2023 1429   ALKPHOS 43 06/21/2023 1429   BILITOT 0.6 06/21/2023 1429   GFRNONAA >60 06/21/2023 1429   GFRAA >60 06/14/2020 1410       PENDING LABS:   RADIOGRAPHIC STUDIES:  No results found.   PATHOLOGY:     ASSESSMENT and THERAPY PLAN:   Prostate cancer [facility]) Jeannett Senior is a 62 year old man with metastatic prostate cancer currently on treatment with Eligard, Zytiga, and Xgeva with good tolerance.  He has no signs of metastatic prostate cancer progression.  He will continue on treatment as he is tolerating it well.  Continue to follow his PSA.  RTC in 4 months for labs, f/u and injection   All questions were answered. The patient knows to call the clinic with any problems, questions or concerns. We can certainly see the patient much sooner if necessary.  Total encounter time:20 minutes*in face-to-face visit time, chart review, lab review, care coordination, order entry, and documentation of the encounter time.    Lillard Anes, NP 06/21/23 3:44 PM Medical Oncology and Hematology [facility] [address] [place], [place] [address] Tel. [phone]    Fax. [phone]  *Total Encounter Time as defined by the Centers for  Medicare and Medicaid Services includes, in addition to the face-to-face time of a patient visit (documented in the note above) non-face-to-face time: obtaining and reviewing outside history, ordering and reviewing medications, tests or procedures, care coordination (communications with other health care professionals or caregivers) and documentation in the medical record.

## 2023-06-24 ENCOUNTER — Other Ambulatory Visit: Payer: Self-pay | Admitting: *Deleted

## 2023-10-07 ENCOUNTER — Other Ambulatory Visit: Payer: Self-pay | Admitting: Hematology and Oncology

## 2023-10-07 DIAGNOSIS — C61 Malignant neoplasm of prostate: Secondary | ICD-10-CM

## 2023-10-08 ENCOUNTER — Encounter: Payer: Self-pay | Admitting: Hematology and Oncology

## 2023-10-22 ENCOUNTER — Telehealth: Payer: Self-pay | Admitting: Hematology and Oncology

## 2023-10-22 NOTE — Telephone Encounter (Signed)
Patient is aware of scheduled appointment times/dates and as well as appointment changes for follow up with Dr. Al Pimple

## 2023-10-25 ENCOUNTER — Inpatient Hospital Stay: Payer: BC Managed Care – PPO

## 2023-10-25 ENCOUNTER — Other Ambulatory Visit: Payer: Self-pay | Admitting: Hematology and Oncology

## 2023-10-25 ENCOUNTER — Inpatient Hospital Stay: Payer: BC Managed Care – PPO | Admitting: Hematology and Oncology

## 2023-10-25 ENCOUNTER — Other Ambulatory Visit: Payer: Self-pay | Admitting: *Deleted

## 2023-10-25 ENCOUNTER — Inpatient Hospital Stay: Payer: BC Managed Care – PPO | Attending: Internal Medicine

## 2023-10-25 VITALS — BP 134/80 | HR 76 | Temp 97.9°F | Resp 16

## 2023-10-25 DIAGNOSIS — Z79899 Other long term (current) drug therapy: Secondary | ICD-10-CM | POA: Diagnosis not present

## 2023-10-25 DIAGNOSIS — F17210 Nicotine dependence, cigarettes, uncomplicated: Secondary | ICD-10-CM | POA: Diagnosis not present

## 2023-10-25 DIAGNOSIS — C61 Malignant neoplasm of prostate: Secondary | ICD-10-CM

## 2023-10-25 DIAGNOSIS — C7951 Secondary malignant neoplasm of bone: Secondary | ICD-10-CM | POA: Diagnosis not present

## 2023-10-25 LAB — CBC WITH DIFFERENTIAL (CANCER CENTER ONLY)
Abs Immature Granulocytes: 0.02 10*3/uL (ref 0.00–0.07)
Basophils Absolute: 0 10*3/uL (ref 0.0–0.1)
Basophils Relative: 1 %
Eosinophils Absolute: 0.1 10*3/uL (ref 0.0–0.5)
Eosinophils Relative: 2 %
HCT: 35.9 % — ABNORMAL LOW (ref 39.0–52.0)
Hemoglobin: 12.2 g/dL — ABNORMAL LOW (ref 13.0–17.0)
Immature Granulocytes: 0 %
Lymphocytes Relative: 18 %
Lymphs Abs: 1 10*3/uL (ref 0.7–4.0)
MCH: 31.4 pg (ref 26.0–34.0)
MCHC: 34 g/dL (ref 30.0–36.0)
MCV: 92.3 fL (ref 80.0–100.0)
Monocytes Absolute: 0.5 10*3/uL (ref 0.1–1.0)
Monocytes Relative: 10 %
Neutro Abs: 3.9 10*3/uL (ref 1.7–7.7)
Neutrophils Relative %: 69 %
Platelet Count: 260 10*3/uL (ref 150–400)
RBC: 3.89 MIL/uL — ABNORMAL LOW (ref 4.22–5.81)
RDW: 13.3 % (ref 11.5–15.5)
WBC Count: 5.6 10*3/uL (ref 4.0–10.5)
nRBC: 0 % (ref 0.0–0.2)

## 2023-10-25 LAB — CMP (CANCER CENTER ONLY)
ALT: 12 U/L (ref 0–44)
AST: 18 U/L (ref 15–41)
Albumin: 4.5 g/dL (ref 3.5–5.0)
Alkaline Phosphatase: 40 U/L (ref 38–126)
Anion gap: 9 (ref 5–15)
BUN: 26 mg/dL — ABNORMAL HIGH (ref 8–23)
CO2: 28 mmol/L (ref 22–32)
Calcium: 10.2 mg/dL (ref 8.9–10.3)
Chloride: 99 mmol/L (ref 98–111)
Creatinine: 1.23 mg/dL (ref 0.61–1.24)
GFR, Estimated: >60 mL/min (ref >60)
Glucose, Bld: 84 mg/dL (ref 70–99)
Potassium: 4.2 mmol/L (ref 3.5–5.1)
Sodium: 136 mmol/L (ref 135–145)
Total Bilirubin: 0.7 mg/dL (ref <1.2)
Total Protein: 7.6 g/dL (ref 6.5–8.1)

## 2023-10-25 MED ORDER — LEUPROLIDE ACETATE (4 MONTH) 30 MG ~~LOC~~ KIT
30.0000 mg | PACK | Freq: Once | SUBCUTANEOUS | Status: AC
  Administered 2023-10-25: 30 mg via SUBCUTANEOUS
  Filled 2023-10-25: qty 30

## 2023-10-25 MED ORDER — DENOSUMAB 120 MG/1.7ML ~~LOC~~ SOLN
120.0000 mg | Freq: Once | SUBCUTANEOUS | Status: AC
  Administered 2023-10-25: 120 mg via SUBCUTANEOUS
  Filled 2023-10-25: qty 1.7

## 2023-10-26 LAB — PROSTATE-SPECIFIC AG, SERUM (LABCORP): Prostate Specific Ag, Serum: <0.1 ng/mL (ref 0.0–4.0)

## 2023-11-11 ENCOUNTER — Encounter: Payer: Self-pay | Admitting: Hematology and Oncology

## 2023-11-18 ENCOUNTER — Inpatient Hospital Stay: Payer: 59 | Admitting: Hematology and Oncology

## 2023-12-09 ENCOUNTER — Encounter: Payer: Self-pay | Admitting: Hematology and Oncology

## 2023-12-09 ENCOUNTER — Other Ambulatory Visit: Payer: Self-pay

## 2023-12-09 ENCOUNTER — Inpatient Hospital Stay: Payer: Self-pay | Attending: Hematology and Oncology | Admitting: Hematology and Oncology

## 2023-12-09 VITALS — BP 122/71 | HR 85 | Temp 97.6°F | Resp 16 | Wt 170.5 lb

## 2023-12-09 DIAGNOSIS — C7951 Secondary malignant neoplasm of bone: Secondary | ICD-10-CM | POA: Insufficient documentation

## 2023-12-09 DIAGNOSIS — F17210 Nicotine dependence, cigarettes, uncomplicated: Secondary | ICD-10-CM | POA: Insufficient documentation

## 2023-12-09 DIAGNOSIS — Z923 Personal history of irradiation: Secondary | ICD-10-CM | POA: Insufficient documentation

## 2023-12-09 DIAGNOSIS — Z79899 Other long term (current) drug therapy: Secondary | ICD-10-CM | POA: Insufficient documentation

## 2023-12-09 DIAGNOSIS — C61 Malignant neoplasm of prostate: Secondary | ICD-10-CM | POA: Insufficient documentation

## 2023-12-09 DIAGNOSIS — D649 Anemia, unspecified: Secondary | ICD-10-CM | POA: Insufficient documentation

## 2023-12-09 NOTE — Progress Notes (Signed)
[facility] Cancer Follow up:    [name], MD (Inactive) [address] [place] [address]   DIAGNOSIS: Prostate cancer  SUMMARY OF ONCOLOGIC HISTORY: Oncology History  Prostate cancer (HCC)  03/24/2018 Initial Biopsy   he was diagnosed with Gleason 4+4 prostate cancer by Dr. Marlou Porch with a PSA of 11.    04/14/2018 Imaging   CT pelvis and bone scan revealed two suspicious iliac lymph nodes and evidence of osseous metastatic disease involving left pubic ramus, left femur, and possibly three ribs, T6 vertebra, and left scapula.    05/19/2018 Treatment Plan Change   Eligard and Zytiga with prednisone, Xgeva added 10/02/2018   07/31/2018 - 09/22/2018 Radiation Therapy   concurrent radiation: The prostate was treated to an initial dose of 45 Gy in 25 fractions of 1.8 Gy, followed by a boost of 30 Gy in 15 fractions of 2 Gy, for a total of 75 Gy.    12/09/2023 Cancer Staging   Staging form: Prostate, AJCC 8th Edition - Clinical: Stage IVB (cTX, cNX, cM1) - Signed by Rachel Moulds, MD on 12/09/2023     CURRENT THERAPY: Berneda Rose  INTERVAL HISTORY: ANDREUS CURE 63 y.o. male returns for follow-up and evaluation prior to receiving his every 31-month Eligard and Xgeva.    The patient, currently on zytiga, Eligard and Xgeva for prostate cancer, reports no new changes in his health status. He has been receiving these treatments every four months and is due for his next round in March. His blood work results have been satisfactory, with undetectable prostate antigen. He has a mild, long-standing anemia, but his CBC, white blood cell count, and platelets are normal.  The patient experiences hot flashes, which become more frequent as he approaches his next injection. He also reports constant fatigue, which he attributes to his irregular work schedule rather than a medical issue. He has no new bone pains, no changes in breathing, and his bowel  movements are regular. He has no trouble urinating and no swelling.  The patient is also on Zytiga and prednisone, which he takes religiously. He reports no new medications or changes in his medication regimen. He has not had any recent dental surgeries or procedures.   Patient Active Problem List   Diagnosis Date Noted   Prostate cancer (HCC) 05/01/2018    is allergic to lexapro [escitalopram], zoloft [sertraline], and lisinopril.  MEDICAL HISTORY: Past Medical History:  Diagnosis Date   Hyperlipemia    Hypertension    Prostate cancer (HCC) 03/24/2018   prostate cancer with bony mets    SURGICAL HISTORY: Past Surgical History:  Procedure Laterality Date   PROSTATE BIOPSY     TONSILLECTOMY      SOCIAL HISTORY: Social History   Socioeconomic History   Marital status: Married    Spouse name: Not on file   Number of children: 2   Years of education: Not on file   Highest education level: Not on file  Occupational History   Not on file  Tobacco Use   Smoking status: Every Day    Current packs/day: 0.50    Average packs/day: 0.5 packs/day for 36.0 years (18.0 ttl pk-yrs)    Types: Cigarettes   Smokeless tobacco: Never  Vaping Use   Vaping status: Never Used  Substance and Sexual Activity   Alcohol use: Never   Drug use: Never   Sexual activity: Not Currently  Other Topics Concern   Not on file  Social History Narrative   Marred with one son and one daughter.    Social Drivers of Corporate investment banker Strain: Not on file  Food Insecurity: Patient Declined (12/03/2022)   Hunger Vital Sign    Worried About Running Out of Food in the Last Year: Patient declined    Ran Out of Food in the Last Year: Patient declined  Transportation Needs: Patient Declined (12/03/2022)   PRAPARE - Administrator, Civil Service (Medical): Patient declined    Lack of Transportation (Non-Medical): Patient declined  Physical Activity: Not on file  Stress: Not on file   Social Connections: Not on file  Intimate Partner Violence: Patient Declined (12/03/2022)   Humiliation, Afraid, Rape, and Kick questionnaire    Fear of Current or Ex-Partner: Patient declined    Emotionally Abused: Patient declined    Physically Abused: Patient declined    Sexually Abused: Patient declined    FAMILY HISTORY: Family History  Problem Relation Age of Onset   Seizures Brother    Aneurysm Brother    Stroke Brother     Review of Systems  Constitutional:  Positive for fatigue. Negative for appetite change, chills, fever and unexpected weight change.  HENT:   Negative for hearing loss, lump/mass and trouble swallowing.   Eyes:  Negative for eye problems and icterus.  Respiratory:  Negative for chest tightness, cough and shortness of breath.   Cardiovascular:  Negative for chest pain, leg swelling and palpitations.  Gastrointestinal:  Negative for abdominal distention, abdominal pain, constipation, diarrhea, nausea and vomiting.  Endocrine: Negative for hot flashes.  Genitourinary:  Negative for difficulty urinating.   Musculoskeletal:  Negative for arthralgias.  Skin:  Negative for itching and rash.  Neurological:  Negative for dizziness, extremity weakness, headaches and numbness.  Hematological:  Negative for adenopathy. Does not bruise/bleed easily.  Psychiatric/Behavioral:  Negative for depression. The patient is not nervous/anxious.       PHYSICAL EXAMINATION    Vitals:   12/09/23 1219  BP: 122/71  Pulse: 85  Resp: 16  Temp: 97.6 F (36.4 C)  SpO2: 97%    Physical Exam Constitutional:      General: He is not in acute distress.    Appearance: Normal appearance. He is not toxic-appearing.  HENT:     Head: Normocephalic and atraumatic.     Mouth/Throat:     Mouth: Mucous membranes are moist.     Pharynx: Oropharynx is clear. No oropharyngeal exudate or posterior oropharyngeal erythema.  Eyes:     General: No scleral icterus. Cardiovascular:      Rate and Rhythm: Normal rate and regular rhythm.     Pulses: Normal pulses.     Heart sounds: Normal heart sounds.  Pulmonary:     Effort: Pulmonary effort is normal.     Breath sounds: Normal breath sounds.  Abdominal:     General: Abdomen is flat. Bowel sounds are normal. There is no distension.     Palpations: Abdomen is soft.     Tenderness: There is no abdominal tenderness.  Musculoskeletal:        General: No swelling.     Cervical back: Neck supple.  Lymphadenopathy:     Cervical: No cervical adenopathy.  Skin:    General: Skin is warm and dry.     Findings: No rash.  Neurological:     General: No focal deficit present.     Mental Status: He is alert.  Psychiatric:  Mood and Affect: Mood normal.        Behavior: Behavior normal.     LABORATORY DATA:  CBC    Component Value Date/Time   WBC 5.6 10/25/2023 1315   RBC 3.89 (L) 10/25/2023 1315   HGB 12.2 (L) 10/25/2023 1315   HCT 35.9 (L) 10/25/2023 1315   PLT 260 10/25/2023 1315   MCV 92.3 10/25/2023 1315   MCH 31.4 10/25/2023 1315   MCHC 34.0 10/25/2023 1315   RDW 13.3 10/25/2023 1315   LYMPHSABS 1.0 10/25/2023 1315   MONOABS 0.5 10/25/2023 1315   EOSABS 0.1 10/25/2023 1315   BASOSABS 0.0 10/25/2023 1315    CMP     Component Value Date/Time   NA 136 10/25/2023 1315   K 4.2 10/25/2023 1315   CL 99 10/25/2023 1315   CO2 28 10/25/2023 1315   GLUCOSE 84 10/25/2023 1315   BUN 26 (H) 10/25/2023 1315   CREATININE 1.23 10/25/2023 1315   CALCIUM 10.2 10/25/2023 1315   PROT 7.6 10/25/2023 1315   ALBUMIN 4.5 10/25/2023 1315   AST 18 10/25/2023 1315   ALT 12 10/25/2023 1315   ALKPHOS 40 10/25/2023 1315   BILITOT 0.7 10/25/2023 1315   GFRNONAA >60 10/25/2023 1315   GFRAA >60 06/14/2020 1410       PENDING LABS:   RADIOGRAPHIC STUDIES:  No results found.   PATHOLOGY:     ASSESSMENT and THERAPY PLAN:   Prostate cancer [facility]) Jeannett Senior is a 63 year old man with metastatic prostate cancer  currently on treatment with Eligard, Zytiga, and Xgeva with good tolerance.  Prostate Cancer Stable on Eligard and Xgeva every four months. PSA less than 0.1. Mild anemia stable. Hot flashes increase in frequency closer to injection time. Fatigue likely related to work schedule. -Continue current treatment regimen. -Next Eligard and Xgeva injections scheduled for February 10, 2024. -Continue Zytiga and Prednisone as prescribed.  General Health Maintenance -Advise dentist of [name] use prior to any dental procedures. -Check labs on February 10, 2024. -Follow-up appointment on February 10, 2024.    All questions were answered. The patient knows to call the clinic with any problems, questions or concerns. We can certainly see the patient much sooner if necessary.  Total encounter time:20 minutes*in face-to-face visit time, chart review, lab review, care coordination, order entry, and documentation of the encounter time.    Lillard Anes, NP 12/09/23 12:48 PM Medical Oncology and Hematology [facility] [address] [place], [place] [address] Tel. [phone]    Fax. [phone]  *Total Encounter Time as defined by the Centers for Medicare and Medicaid Services includes, in addition to the face-to-face time of a patient visit (documented in the note above) non-face-to-face time: obtaining and reviewing outside history, ordering and reviewing medications, tests or procedures, care coordination (communications with other health care professionals or caregivers) and documentation in the medical record.

## 2023-12-09 NOTE — Assessment & Plan Note (Addendum)
Dan Farrell is a 63 year old man with metastatic prostate cancer currently on treatment with Eligard, Zytiga, and Xgeva with good tolerance.  Prostate Cancer Stable on Eligard and Xgeva every four months. PSA less than 0.1. Mild anemia stable. Hot flashes increase in frequency closer to injection time. Fatigue likely related to work schedule. -Continue current treatment regimen. -Next Eligard and Xgeva injections scheduled for February 10, 2024. -Continue Zytiga and Prednisone as prescribed.  General Health Maintenance -Advise dentist of [name] use prior to any dental procedures. -Check labs on February 10, 2024. -Follow-up appointment on February 10, 2024.

## 2024-01-20 ENCOUNTER — Inpatient Hospital Stay

## 2024-02-07 ENCOUNTER — Other Ambulatory Visit: Payer: Self-pay | Admitting: *Deleted

## 2024-02-07 DIAGNOSIS — C61 Malignant neoplasm of prostate: Secondary | ICD-10-CM

## 2024-02-10 ENCOUNTER — Inpatient Hospital Stay: Payer: Self-pay

## 2024-02-10 ENCOUNTER — Inpatient Hospital Stay (HOSPITAL_BASED_OUTPATIENT_CLINIC_OR_DEPARTMENT_OTHER): Payer: Self-pay | Admitting: Adult Health

## 2024-02-10 ENCOUNTER — Other Ambulatory Visit: Payer: Self-pay

## 2024-02-10 ENCOUNTER — Inpatient Hospital Stay: Payer: Self-pay | Attending: Hematology and Oncology

## 2024-02-10 VITALS — BP 134/67 | HR 81 | Temp 97.6°F | Resp 16 | Wt 171.2 lb

## 2024-02-10 DIAGNOSIS — I10 Essential (primary) hypertension: Secondary | ICD-10-CM | POA: Diagnosis not present

## 2024-02-10 DIAGNOSIS — Z79899 Other long term (current) drug therapy: Secondary | ICD-10-CM | POA: Diagnosis not present

## 2024-02-10 DIAGNOSIS — F17210 Nicotine dependence, cigarettes, uncomplicated: Secondary | ICD-10-CM | POA: Diagnosis not present

## 2024-02-10 DIAGNOSIS — C7951 Secondary malignant neoplasm of bone: Secondary | ICD-10-CM | POA: Diagnosis not present

## 2024-02-10 DIAGNOSIS — C61 Malignant neoplasm of prostate: Secondary | ICD-10-CM

## 2024-02-10 LAB — CMP (CANCER CENTER ONLY)
ALT: 12 U/L (ref 0–44)
AST: 18 U/L (ref 15–41)
Albumin: 4.4 g/dL (ref 3.5–5.0)
Alkaline Phosphatase: 41 U/L (ref 38–126)
Anion gap: 8 (ref 5–15)
BUN: 15 mg/dL (ref 8–23)
CO2: 28 mmol/L (ref 22–32)
Calcium: 9.5 mg/dL (ref 8.9–10.3)
Chloride: 99 mmol/L (ref 98–111)
Creatinine: 1.07 mg/dL (ref 0.61–1.24)
GFR, Estimated: >60 mL/min (ref >60)
Glucose, Bld: 90 mg/dL (ref 70–99)
Potassium: 3.6 mmol/L (ref 3.5–5.1)
Sodium: 135 mmol/L (ref 135–145)
Total Bilirubin: 0.4 mg/dL (ref 0.0–1.2)
Total Protein: 7.3 g/dL (ref 6.5–8.1)

## 2024-02-10 LAB — CBC WITH DIFFERENTIAL (CANCER CENTER ONLY)
Abs Immature Granulocytes: 0.01 10*3/uL (ref 0.00–0.07)
Basophils Absolute: 0 10*3/uL (ref 0.0–0.1)
Basophils Relative: 0 %
Eosinophils Absolute: 0.1 10*3/uL (ref 0.0–0.5)
Eosinophils Relative: 2 %
HCT: 34 % — ABNORMAL LOW (ref 39.0–52.0)
Hemoglobin: 11.6 g/dL — ABNORMAL LOW (ref 13.0–17.0)
Immature Granulocytes: 0 %
Lymphocytes Relative: 18 %
Lymphs Abs: 0.9 10*3/uL (ref 0.7–4.0)
MCH: 31.4 pg (ref 26.0–34.0)
MCHC: 34.1 g/dL (ref 30.0–36.0)
MCV: 92.1 fL (ref 80.0–100.0)
Monocytes Absolute: 0.4 10*3/uL (ref 0.1–1.0)
Monocytes Relative: 9 %
Neutro Abs: 3.4 10*3/uL (ref 1.7–7.7)
Neutrophils Relative %: 71 %
Platelet Count: 247 10*3/uL (ref 150–400)
RBC: 3.69 MIL/uL — ABNORMAL LOW (ref 4.22–5.81)
RDW: 13.6 % (ref 11.5–15.5)
WBC Count: 4.8 10*3/uL (ref 4.0–10.5)
nRBC: 0 % (ref 0.0–0.2)

## 2024-02-10 MED ORDER — LEUPROLIDE ACETATE (4 MONTH) 30 MG ~~LOC~~ KIT
30.0000 mg | PACK | Freq: Once | SUBCUTANEOUS | Status: AC
  Administered 2024-02-10: 30 mg via SUBCUTANEOUS
  Filled 2024-02-10: qty 30

## 2024-02-10 MED ORDER — DENOSUMAB 120 MG/1.7ML ~~LOC~~ SOLN
120.0000 mg | Freq: Once | SUBCUTANEOUS | Status: AC
  Administered 2024-02-10: 120 mg via SUBCUTANEOUS
  Filled 2024-02-10: qty 1.7

## 2024-02-10 NOTE — Progress Notes (Unsigned)
 [facility] Cancer Follow up:    Dan Heys, MD (Inactive) [address] [place] [employer] [place] [address]   DIAGNOSIS: Cancer Staging  Prostate cancer [facility]) Staging form: Prostate, AJCC 8th Edition - Clinical: Stage IVB (cTX, cNX, cM1) - Signed by Dan Moulds, MD on 12/09/2023   SUMMARY OF ONCOLOGIC HISTORY: Oncology History  Prostate cancer (HCC)  03/24/2018 Initial Biopsy   he was diagnosed with Gleason 4+4 prostate cancer by Dr. Marlou Porch with a PSA of 11.    04/14/2018 Imaging   CT pelvis and bone scan revealed two suspicious iliac lymph nodes and evidence of osseous metastatic disease involving left pubic ramus, left femur, and possibly three ribs, T6 vertebra, and left scapula.    05/19/2018 Treatment Plan Change   Eligard and Zytiga with prednisone, Xgeva added 10/02/2018   07/31/2018 - 09/22/2018 Radiation Therapy   concurrent radiation: The prostate was treated to an initial dose of 45 Gy in 25 fractions of 1.8 Gy, followed by a boost of 30 Gy in 15 fractions of 2 Gy, for a total of 75 Gy.    12/09/2023 Cancer Staging   Staging form: Prostate, AJCC 8th Edition - Clinical: Stage IVB (cTX, cNX, cM1) - Signed by Dan Moulds, MD on 12/09/2023     CURRENT THERAPY: Berneda Rose, Prednisone  INTERVAL HISTORY:  Discussed the use of AI scribe software for clinical note transcription with the patient, who gave verbal consent to proceed.  Dan Farrell 63 y.o. male returns for    Patient Active Problem List   Diagnosis Date Noted   Prostate cancer (HCC) 05/01/2018    is allergic to lexapro [escitalopram], zoloft [sertraline], and lisinopril.  MEDICAL HISTORY: Past Medical History:  Diagnosis Date   Hyperlipemia    Hypertension    Prostate cancer (HCC) 03/24/2018   prostate cancer with bony mets    SURGICAL HISTORY: Past Surgical History:  Procedure Laterality Date   PROSTATE BIOPSY     TONSILLECTOMY      SOCIAL  HISTORY: Social History   Socioeconomic History   Marital status: Married    Spouse name: Not on file   Number of children: 2   Years of education: Not on file   Highest education level: Not on file  Occupational History   Not on file  Tobacco Use   Smoking status: Every Day    Current packs/day: 0.50    Average packs/day: 0.5 packs/day for 36.0 years (18.0 ttl pk-yrs)    Types: Cigarettes   Smokeless tobacco: Never  Vaping Use   Vaping status: Never Used  Substance and Sexual Activity   Alcohol use: Never   Drug use: Never   Sexual activity: Not Currently  Other Topics Concern   Not on file  Social History Narrative   Marred with one son and one daughter.    Social Drivers of Corporate investment banker Strain: Not on file  Food Insecurity: Patient Declined (12/03/2022)   Hunger Vital Sign    Worried About Running Out of Food in the Last Year: Patient declined    Ran Out of Food in the Last Year: Patient declined  Transportation Needs: Patient Declined (12/03/2022)   PRAPARE - Administrator, Civil Service (Medical): Patient declined    Lack of Transportation (Non-Medical): Patient declined  Physical Activity: Not on file  Stress: Not on file  Social Connections: Not on file  Intimate Partner Violence: Patient Declined (12/03/2022)   Humiliation,  Afraid, Rape, and Kick questionnaire    Fear of Current or Ex-Partner: Patient declined    Emotionally Abused: Patient declined    Physically Abused: Patient declined    Sexually Abused: Patient declined    FAMILY HISTORY: Family History  Problem Relation Age of Onset   Seizures Brother    Aneurysm Brother    Stroke Brother     Review of Systems - Oncology    PHYSICAL EXAMINATION    Vitals:   02/10/24 1413  BP: 134/67  Pulse: 81  Resp: 16  Temp: 97.6 F (36.4 C)  SpO2: 99%    Physical Exam  LABORATORY DATA:  CBC    Component Value Date/Time   WBC 4.8 02/10/2024 1355   RBC 3.69 (L)  02/10/2024 1355   HGB 11.6 (L) 02/10/2024 1355   HCT 34.0 (L) 02/10/2024 1355   PLT 247 02/10/2024 1355   MCV 92.1 02/10/2024 1355   MCH 31.4 02/10/2024 1355   MCHC 34.1 02/10/2024 1355   RDW 13.6 02/10/2024 1355   LYMPHSABS 0.9 02/10/2024 1355   MONOABS 0.4 02/10/2024 1355   EOSABS 0.1 02/10/2024 1355   BASOSABS 0.0 02/10/2024 1355    CMP     Component Value Date/Time   NA 136 10/25/2023 1315   K 4.2 10/25/2023 1315   CL 99 10/25/2023 1315   CO2 28 10/25/2023 1315   GLUCOSE 84 10/25/2023 1315   BUN 26 (H) 10/25/2023 1315   CREATININE 1.23 10/25/2023 1315   CALCIUM 10.2 10/25/2023 1315   PROT 7.6 10/25/2023 1315   ALBUMIN 4.5 10/25/2023 1315   AST 18 10/25/2023 1315   ALT 12 10/25/2023 1315   ALKPHOS 40 10/25/2023 1315   BILITOT 0.7 10/25/2023 1315   GFRNONAA >60 10/25/2023 1315   GFRAA >60 06/14/2020 1410       PENDING LABS:   RADIOGRAPHIC STUDIES:  No results found.   PATHOLOGY:     ASSESSMENT and THERAPY PLAN:   No problem-specific Assessment & Plan notes found for this encounter.   No orders of the defined types were placed in this encounter.   All questions were answered. The patient knows to call the clinic with any problems, questions or concerns. We can certainly see the patient much sooner if necessary. This note was electronically signed. Noreene Filbert, NP 02/10/2024

## 2024-02-10 NOTE — Progress Notes (Deleted)
 Ok to proceed with [name] and Eligard early.  Anola Gurney [place], [place], BCPS, BCOP 02/10/2024 3:00 PM

## 2024-02-11 LAB — PROSTATE-SPECIFIC AG, SERUM (LABCORP): Prostate Specific Ag, Serum: <0.1 ng/mL (ref 0.0–4.0)

## 2024-02-13 ENCOUNTER — Encounter: Payer: Self-pay | Admitting: Hematology and Oncology

## 2024-02-13 ENCOUNTER — Encounter: Payer: Self-pay | Admitting: Adult Health

## 2024-02-13 NOTE — Assessment & Plan Note (Signed)
 Prostate Cancer He is on Zytiga, Lupron, Xgeva, and Prednisone.  Reports no new issues or pain. Tolerating prednisone well, which manages treatment side effects. Slight anemia likely due to Zytiga, but asymptomatic. Active at work, beneficial for overall health. Discussed risks of abrupt prednisone cessation, including steroid withdrawal, blood pressure fluctuations, hormonal changes, and adrenal insufficiency. Prednisone aids appetite and energy but may increase blood sugar and blood pressure.No current dental issues, advised regular dental check-ups due to potential Xgeva complications. - Administer Xgeva injection - Encourage dental check-ups - Consider starting dental insurance - Ensure adequate calcium intake - Continue Zytiga and Lupron therapy - Continue prednisone as prescribed - Monitor hemoglobin levels - Encourage regular physical activity - Educate on risks of stopping prednisone abruptly  Hypertension Blood pressure well-controlled at 134/67 mmHg, within target range. On prednisone, which can affect blood pressure, so monitoring is important. - Continue monitoring blood pressure regularly - Continue f/u with PCP for HTN management  Prediabetes Previously borderline prediabetic, likely due to increased dessert consumption. Reduced dessert intake, recent blood sugar levels stable. No current A1c available, but recent sugars acceptable. - Monitor blood sugar levels regularly - Encourage healthy dietary habits  RTC in 4 months for labs, f/u, and injection.

## 2024-02-28 ENCOUNTER — Inpatient Hospital Stay: Payer: Self-pay | Admitting: Hematology and Oncology

## 2024-02-28 ENCOUNTER — Inpatient Hospital Stay: Payer: Self-pay

## 2024-03-30 ENCOUNTER — Telehealth: Payer: Self-pay | Admitting: *Deleted

## 2024-03-30 DIAGNOSIS — C61 Malignant neoplasm of prostate: Secondary | ICD-10-CM

## 2024-03-30 NOTE — Telephone Encounter (Signed)
 Dan Farrell called -  has been trying to get his Zytiga  refilled, starting last week. He currently has two days left. He was told by pharmacy it was 'something to do with insurance'.  Last prescription send 10/08/2023. Per Dr. Arno Bibles OV note 12/09/2023: Continue Zytiga  and Prednisone  as prescribed.   Review of chart shows a past PA that ran from May 2023 to May 2024, so the next PA from May 2024 to May 2025 has likely ended.  Contacted [facility] and provided this info. Asked if the pharmacy already has PA ppwk and is completing it or if not, requested someone follow up.   Dan Farrell is concerned about missing a few days if they don't ship it fast enough. Advised him that while it isn't an ideal situation and would never be prescribed that way, missing 1-2 days should be tolerated without ill effect.  Advised him the office/[facility] pharmacy will provide updates and if he has not been contacted tomorrow, please contact us  by end of day. He verbalized understanding of all information.

## 2024-03-31 ENCOUNTER — Other Ambulatory Visit (HOSPITAL_COMMUNITY): Payer: Self-pay

## 2024-03-31 ENCOUNTER — Telehealth: Payer: Self-pay

## 2024-03-31 ENCOUNTER — Telehealth: Payer: Self-pay | Admitting: *Deleted

## 2024-03-31 ENCOUNTER — Telehealth: Payer: Self-pay | Admitting: Pharmacy Technician

## 2024-03-31 ENCOUNTER — Encounter: Payer: Self-pay | Admitting: Hematology and Oncology

## 2024-03-31 NOTE — Telephone Encounter (Signed)
 Pharmacy called related to Rx: Zytiga  prior authorization .[facility]  oncology RN, Cleda Curly, wuo provided number for [facility] [phone]) to assist with prior authorizations.  Jammal Sarr J. Rachel Budds, RN, BSN, NCM  Transitions of Care  Nurse Case Manager  [facility] Emergency Departments  Operative Services  [phone]

## 2024-03-31 NOTE — Telephone Encounter (Signed)
 Oral Oncology Patient Advocate Encounter  Called [facility] to inform them of the PA approval and to ship. Rep confirmed that everything processed and pt would have a $5 copay as he's had in the past. Will deliver tomorrow. 04/01/24  Roda Cirri, CPhT Specialty Pharmacy Patient Advocate Phone: [phone] Fax: [phone]

## 2024-03-31 NOTE — Telephone Encounter (Signed)
 Oral Oncology Patient Advocate Encounter  Re-authorization   Received notification that prior authorization for Abiraterone  is due for renewal.   PA submitted on 03/31/24  Key [id number]  Status is pending     Hansel Ley, CPhT Pharmacy Technician Coordinator [facility] [phone] (Ph) 03/31/2024 11:20 AM

## 2024-03-31 NOTE — Telephone Encounter (Signed)
 Oral Oncology Patient Advocate Encounter  Prior Authorization for abiraterone  has been approved.    PA# [id number] Effective dates: 03/31/24 through 03/31/25  Patient must fill through [facility] pharmacy   Roda Cirri, CPhT Specialty Pharmacy Patient Advocate Phone: [phone] Fax: [phone]

## 2024-04-20 ENCOUNTER — Other Ambulatory Visit: Payer: Self-pay | Admitting: Hematology and Oncology

## 2024-04-21 ENCOUNTER — Encounter: Payer: Self-pay | Admitting: *Deleted

## 2024-04-21 ENCOUNTER — Encounter: Payer: Self-pay | Admitting: Hematology and Oncology

## 2024-05-04 ENCOUNTER — Encounter: Payer: Self-pay | Admitting: Hematology and Oncology

## 2024-05-08 ENCOUNTER — Other Ambulatory Visit: Payer: Self-pay | Admitting: Hematology and Oncology

## 2024-05-08 DIAGNOSIS — C61 Malignant neoplasm of prostate: Secondary | ICD-10-CM

## 2024-05-12 ENCOUNTER — Telehealth: Payer: Self-pay | Admitting: Hematology and Oncology

## 2024-05-12 NOTE — Telephone Encounter (Signed)
 Spoke with patient confirming upcoming appointment changes

## 2024-06-05 ENCOUNTER — Other Ambulatory Visit: Payer: Self-pay | Admitting: *Deleted

## 2024-06-05 ENCOUNTER — Telehealth: Payer: Self-pay

## 2024-06-05 DIAGNOSIS — C61 Malignant neoplasm of prostate: Secondary | ICD-10-CM

## 2024-06-08 ENCOUNTER — Inpatient Hospital Stay (HOSPITAL_BASED_OUTPATIENT_CLINIC_OR_DEPARTMENT_OTHER): Payer: Self-pay | Admitting: Hematology and Oncology

## 2024-06-08 ENCOUNTER — Ambulatory Visit: Payer: Self-pay

## 2024-06-08 ENCOUNTER — Inpatient Hospital Stay: Payer: Self-pay | Attending: Hematology and Oncology

## 2024-06-08 ENCOUNTER — Ambulatory Visit: Payer: Self-pay | Admitting: Hematology and Oncology

## 2024-06-08 ENCOUNTER — Inpatient Hospital Stay: Payer: Self-pay

## 2024-06-08 ENCOUNTER — Other Ambulatory Visit: Payer: Self-pay

## 2024-06-08 VITALS — BP 150/77 | HR 76 | Temp 98.3°F | Resp 17 | Wt 172.9 lb

## 2024-06-08 DIAGNOSIS — R232 Flushing: Secondary | ICD-10-CM | POA: Diagnosis not present

## 2024-06-08 DIAGNOSIS — F17210 Nicotine dependence, cigarettes, uncomplicated: Secondary | ICD-10-CM | POA: Insufficient documentation

## 2024-06-08 DIAGNOSIS — C7951 Secondary malignant neoplasm of bone: Secondary | ICD-10-CM | POA: Insufficient documentation

## 2024-06-08 DIAGNOSIS — C61 Malignant neoplasm of prostate: Secondary | ICD-10-CM | POA: Insufficient documentation

## 2024-06-08 DIAGNOSIS — Z79899 Other long term (current) drug therapy: Secondary | ICD-10-CM | POA: Diagnosis not present

## 2024-06-08 DIAGNOSIS — Z5112 Encounter for antineoplastic immunotherapy: Secondary | ICD-10-CM | POA: Insufficient documentation

## 2024-06-08 LAB — CBC WITH DIFFERENTIAL (CANCER CENTER ONLY)
Abs Immature Granulocytes: 0.01 K/uL (ref 0.00–0.07)
Basophils Absolute: 0 K/uL (ref 0.0–0.1)
Basophils Relative: 1 %
Eosinophils Absolute: 0.1 K/uL (ref 0.0–0.5)
Eosinophils Relative: 2 %
HCT: 33.3 % — ABNORMAL LOW (ref 39.0–52.0)
Hemoglobin: 11.5 g/dL — ABNORMAL LOW (ref 13.0–17.0)
Immature Granulocytes: 0 %
Lymphocytes Relative: 20 %
Lymphs Abs: 0.9 K/uL (ref 0.7–4.0)
MCH: 31.8 pg (ref 26.0–34.0)
MCHC: 34.5 g/dL (ref 30.0–36.0)
MCV: 92 fL (ref 80.0–100.0)
Monocytes Absolute: 0.4 K/uL (ref 0.1–1.0)
Monocytes Relative: 9 %
Neutro Abs: 3 K/uL (ref 1.7–7.7)
Neutrophils Relative %: 68 %
Platelet Count: 231 K/uL (ref 150–400)
RBC: 3.62 MIL/uL — ABNORMAL LOW (ref 4.22–5.81)
RDW: 13.5 % (ref 11.5–15.5)
WBC Count: 4.4 K/uL (ref 4.0–10.5)
nRBC: 0 % (ref 0.0–0.2)

## 2024-06-08 LAB — CMP (CANCER CENTER ONLY)
ALT: 10 U/L (ref 0–44)
AST: 16 U/L (ref 15–41)
Albumin: 4.1 g/dL (ref 3.5–5.0)
Alkaline Phosphatase: 45 U/L (ref 38–126)
Anion gap: 7 (ref 5–15)
BUN: 18 mg/dL (ref 8–23)
CO2: 27 mmol/L (ref 22–32)
Calcium: 9.4 mg/dL (ref 8.9–10.3)
Chloride: 105 mmol/L (ref 98–111)
Creatinine: 1.06 mg/dL (ref 0.61–1.24)
GFR, Estimated: >60 mL/min (ref >60)
Glucose, Bld: 86 mg/dL (ref 70–99)
Potassium: 4.1 mmol/L (ref 3.5–5.1)
Sodium: 139 mmol/L (ref 135–145)
Total Bilirubin: 0.4 mg/dL (ref 0.0–1.2)
Total Protein: 7.4 g/dL (ref 6.5–8.1)

## 2024-06-08 MED ORDER — LEUPROLIDE ACETATE (4 MONTH) 30 MG ~~LOC~~ KIT
30.0000 mg | PACK | Freq: Once | SUBCUTANEOUS | Status: AC
Start: 2024-06-08 — End: 2024-06-08
  Administered 2024-06-08: 30 mg via SUBCUTANEOUS
  Filled 2024-06-08: qty 30

## 2024-06-08 MED ORDER — PREDNISONE 5 MG PO TABS: ORAL_TABLET | ORAL | 3 refills | Status: AC

## 2024-06-08 MED ORDER — ABIRTEGA 250 MG PO TABS: 1000.0000 mg | ORAL_TABLET | Freq: Every day | ORAL | 6 refills | Status: AC

## 2024-06-08 MED ORDER — DENOSUMAB 120 MG/1.7ML ~~LOC~~ SOLN
120.0000 mg | Freq: Once | SUBCUTANEOUS | Status: AC
Start: 2024-06-08 — End: 2024-06-08
  Administered 2024-06-08: 120 mg via SUBCUTANEOUS
  Filled 2024-06-08: qty 1.7

## 2024-06-08 NOTE — Progress Notes (Signed)
 [facility] Cancer Follow up:    Dan Charleston, MD (Inactive) [address] [place] [address]   DIAGNOSIS: Prostate cancer  SUMMARY OF ONCOLOGIC HISTORY: Oncology History  Prostate cancer (HCC)  03/24/2018 Initial Biopsy   he was diagnosed with Gleason 4+4 prostate cancer by Dr. Cam with a PSA of 11.    04/14/2018 Imaging   CT pelvis and bone scan revealed two suspicious iliac lymph nodes and evidence of osseous metastatic disease involving left pubic ramus, left femur, and possibly three ribs, T6 vertebra, and left scapula.    05/19/2018 Treatment Plan Change   Eligard  and Zytiga  with prednisone , Xgeva  added 10/02/2018   07/31/2018 - 09/22/2018 Radiation Therapy   concurrent radiation: The prostate was treated to an initial dose of 45 Gy in 25 fractions of 1.8 Gy, followed by a boost of 30 Gy in 15 fractions of 2 Gy, for a total of 75 Gy.    12/09/2023 Cancer Staging   Staging form: Prostate, AJCC 8th Edition - Clinical: Stage IVB (cTX, cNX, cM1) - Signed by Loretha Ash, MD on 12/09/2023     CURRENT THERAPY: Eligard , Zytiga , Xgeva   INTERVAL HISTORY: Dan Farrell 63 y.o. male returns for follow-up and evaluation prior to receiving his every 30-month Eligard  and Xgeva .     Patient Active Problem List   Diagnosis Date Noted   Prostate cancer (HCC) 05/01/2018   Discussed the use of AI scribe software for clinical note transcription with the patient, who gave verbal consent to proceed.  History of Present Illness Dan Farrell is a 63 year old male with prostate cancer who presents with worsening hot flashes.  He has been experiencing more intense hot flashes following his recent Lupron  injection, describing them as 'violent' and more severe than previous episodes. These hot flashes have been disrupting his sleep, causing frequent awakenings at night.  He is currently receiving Lupron  injections every four months and is also on  Zytiga  and prednisone . He drinks water throughout the day and has not noticed any other new symptoms such as bone pain, changes in breathing, bowel habits, urination issues, or dental problems.  He has a history of mild anemia. He has a history of prostate cancer and is currently on treatment with Lupron , Zytiga , and Xgeva . His PSA levels have been stable, with the last result being less than 0.1.  He has two children, a son who is a Forensic scientist and a daughter who works as a Geologist, engineering. Both children are financially independent.   is allergic to lexapro [escitalopram], zoloft [sertraline], and lisinopril.  MEDICAL HISTORY: Past Medical History:  Diagnosis Date   Hyperlipemia    Hypertension    Prostate cancer (HCC) 03/24/2018   prostate cancer with bony mets    SURGICAL HISTORY: Past Surgical History:  Procedure Laterality Date   PROSTATE BIOPSY     TONSILLECTOMY      SOCIAL HISTORY: Social History   Socioeconomic History   Marital status: Married    Spouse name: Not on file   Number of children: 2   Years of education: Not on file   Highest education level: Not on file  Occupational History   Not on file  Tobacco Use   Smoking status: Every Day    Current packs/day: 0.50    Average packs/day: 0.5 packs/day for 36.0 years (18.0 ttl pk-yrs)    Types: Cigarettes   Smokeless tobacco: Never  Vaping Use   Vaping status: Never  Used  Substance and Sexual Activity   Alcohol use: Never   Drug use: Never   Sexual activity: Not Currently  Other Topics Concern   Not on file  Social History Narrative   Marred with one son and one daughter.    Social Drivers of Corporate investment banker Strain: Not on file  Food Insecurity: Patient Declined (12/03/2022)   Hunger Vital Sign    Worried About Running Out of Food in the Last Year: Patient declined    Ran Out of Food in the Last Year: Patient declined  Transportation Needs: Patient Declined (12/03/2022)   PRAPARE -  Administrator, Civil Service (Medical): Patient declined    Lack of Transportation (Non-Medical): Patient declined  Physical Activity: Not on file  Stress: Not on file  Social Connections: Not on file  Intimate Partner Violence: Patient Declined (12/03/2022)   Humiliation, Afraid, Rape, and Kick questionnaire    Fear of Current or Ex-Partner: Patient declined    Emotionally Abused: Patient declined    Physically Abused: Patient declined    Sexually Abused: Patient declined    FAMILY HISTORY: Family History  Problem Relation Age of Onset   Seizures Brother    Aneurysm Brother    Stroke Brother     Review of Systems  Constitutional:  Positive for fatigue. Negative for appetite change, chills, fever and unexpected weight change.  HENT:   Negative for hearing loss, lump/mass and trouble swallowing.   Eyes:  Negative for eye problems and icterus.  Respiratory:  Negative for chest tightness, cough and shortness of breath.   Cardiovascular:  Negative for chest pain, leg swelling and palpitations.  Gastrointestinal:  Negative for abdominal distention, abdominal pain, constipation, diarrhea, nausea and vomiting.  Endocrine: Negative for hot flashes.  Genitourinary:  Negative for difficulty urinating.   Musculoskeletal:  Negative for arthralgias.  Skin:  Negative for itching and rash.  Neurological:  Negative for dizziness, extremity weakness, headaches and numbness.  Hematological:  Negative for adenopathy. Does not bruise/bleed easily.  Psychiatric/Behavioral:  Negative for depression. The patient is not nervous/anxious.       PHYSICAL EXAMINATION   Onc Performance Status - 06/08/24 1300       KPS SCALE   KPS % SCORE Able to carry on normal activity, minor s/s of disease          Vitals:   06/08/24 1356 06/08/24 1357  BP: (!) 154/79 (!) 150/77  Pulse: 76   Resp: 17   Temp: 98.3 F (36.8 C)   SpO2: 98%     Physical Exam Constitutional:      General: He  is not in acute distress.    Appearance: Normal appearance. He is not toxic-appearing.  HENT:     Head: Normocephalic and atraumatic.     Mouth/Throat:     Mouth: Mucous membranes are moist.     Pharynx: Oropharynx is clear. No oropharyngeal exudate or posterior oropharyngeal erythema.  Eyes:     General: No scleral icterus. Cardiovascular:     Rate and Rhythm: Normal rate and regular rhythm.     Pulses: Normal pulses.     Heart sounds: Normal heart sounds.  Pulmonary:     Effort: Pulmonary effort is normal.     Breath sounds: Normal breath sounds.  Abdominal:     General: Abdomen is flat. Bowel sounds are normal. There is no distension.     Palpations: Abdomen is soft.     Tenderness: There  is no abdominal tenderness.  Musculoskeletal:        General: No swelling.     Cervical back: Neck supple.  Lymphadenopathy:     Cervical: No cervical adenopathy.  Skin:    General: Skin is warm and dry.     Findings: No rash.  Neurological:     General: No focal deficit present.     Mental Status: He is alert.  Psychiatric:        Mood and Affect: Mood normal.        Behavior: Behavior normal.     LABORATORY DATA:  CBC    Component Value Date/Time   WBC 4.4 06/08/2024 1328   RBC 3.62 (L) 06/08/2024 1328   HGB 11.5 (L) 06/08/2024 1328   HCT 33.3 (L) 06/08/2024 1328   PLT 231 06/08/2024 1328   MCV 92.0 06/08/2024 1328   MCH 31.8 06/08/2024 1328   MCHC 34.5 06/08/2024 1328   RDW 13.5 06/08/2024 1328   LYMPHSABS 0.9 06/08/2024 1328   MONOABS 0.4 06/08/2024 1328   EOSABS 0.1 06/08/2024 1328   BASOSABS 0.0 06/08/2024 1328    CMP     Component Value Date/Time   NA 135 02/10/2024 1355   K 3.6 02/10/2024 1355   CL 99 02/10/2024 1355   CO2 28 02/10/2024 1355   GLUCOSE 90 02/10/2024 1355   BUN 15 02/10/2024 1355   CREATININE 1.07 02/10/2024 1355   CALCIUM  9.5 02/10/2024 1355   PROT 7.3 02/10/2024 1355   ALBUMIN 4.4 02/10/2024 1355   AST 18 02/10/2024 1355   ALT 12  02/10/2024 1355   ALKPHOS 41 02/10/2024 1355   BILITOT 0.4 02/10/2024 1355   GFRNONAA >60 02/10/2024 1355   GFRAA >60 06/14/2020 1410       PENDING LABS:   RADIOGRAPHIC STUDIES:  No results found.   PATHOLOGY:     ASSESSMENT and THERAPY PLAN:   Assessment and Plan Assessment & Plan Metastatic Prostate cancer PSA levels controlled with Zytiga , prednisone , Lupron , and Xgeva . No new symptoms or complications. Experiencing severe hot flashes as a side effect of Lupron .  - Continue lupron , Zytiga  and prednisone . - Administer Xgeva  injection. - Monitor PSA levels. - Ensure refills for Zytiga  and prednisone  are up to date.  Hot flashes due to Lupron  Severe hot flashes post-Lupron  injection, affecting sleep. Discussed surgical castration as an alternative. - Refer to urology for consultation regarding surgical castration. - Continue current Lupron  regimen. - Monitor hot flash severity and consider treatment if he persists.   All questions were answered. The patient knows to call the clinic with any problems, questions or concerns. We can certainly see the patient much sooner if necessary.  Total encounter time:20 minutes*in face-to-face visit time, chart review, lab review, care coordination, order entry, and documentation of the encounter time.  *Total Encounter Time as defined by the Centers for Medicare and Medicaid Services includes, in addition to the face-to-face time of a patient visit (documented in the note above) non-face-to-face time: obtaining and reviewing outside history, ordering and reviewing medications, tests or procedures, care coordination (communications with other health care professionals or caregivers) and documentation in the medical record.

## 2024-06-08 NOTE — Assessment & Plan Note (Signed)
 Prostate Cancer He is on Zytiga , Lupron , Xgeva , and Prednisone .  Reports no new issues or pain. Tolerating prednisone  well, which manages treatment side effects. Slight anemia likely due to Zytiga , but asymptomatic. Active at work, beneficial for overall health. Discussed risks of abrupt prednisone  cessation, including steroid withdrawal, blood pressure fluctuations, hormonal changes, and adrenal insufficiency. Prednisone  aids appetite and energy but may increase blood sugar and blood pressure.No current dental issues, advised regular dental check-ups due to potential Xgeva  complications.

## 2024-06-09 LAB — PROSTATE-SPECIFIC AG, SERUM (LABCORP): Prostate Specific Ag, Serum: <0.1 ng/mL (ref 0.0–4.0)

## 2024-08-31 ENCOUNTER — Telehealth: Payer: Self-pay | Admitting: Hematology and Oncology

## 2024-08-31 NOTE — Telephone Encounter (Signed)
 I contacted patient to reschedule appointments with Dr. Loretha and he stated he would like to switch care to Dr. Tina. Dr. Loretha is aware of this transition.

## 2024-09-25 ENCOUNTER — Other Ambulatory Visit: Payer: Self-pay | Admitting: *Deleted

## 2024-09-25 DIAGNOSIS — C61 Malignant neoplasm of prostate: Secondary | ICD-10-CM

## 2024-09-27 ENCOUNTER — Other Ambulatory Visit: Payer: Self-pay

## 2024-09-27 DIAGNOSIS — C61 Malignant neoplasm of prostate: Secondary | ICD-10-CM

## 2024-09-27 NOTE — Progress Notes (Unsigned)
 [facility] CONSULT NOTE  Patient Care Team: Hugh Charleston, MD (Inactive) as PCP - General (Family Medicine)  ASSESSMENT & PLAN:  Dan is a 63 y.o.male with history of HTN being [facility] for prostate cancer.  Current diagnosis: mHSPC with undetectable PSA.  PSA less than 0.1 since 09/2018. Initial diagnosis: mHSPC with bone metastases in 2019.  iPSA 11 GS 4+4.  Bone scan showed multiple sites of bone metastases. Germline testing: Not yet will refer Somatic testing: Not yet, Previous treatment: Radiation therapy to the prostate completed and November 2019. He received 45 Gy in 25 fractions followed by 30 Gy in 15 fractions   Current treatment:  Eligard  30 mg every 4 months.  Since 2019   Zytiga  1000 mg with prednisone  5 mg started in July 2019   Xgeva  120 mg every 4 months.  Since 09/2018.  The patient was counseled on the natural history of prostate cancer and the standard treatment options that are available for prostate cancer.  Assessment & Plan Prostate cancer [facility]) Continue treatment that has been working for him with Zytiga , Lupron , Xgeva , and Prednisone .    At risk for side effect of medication baseline bone mineral density study and then every 2 years calcium  (1000-1200 mg daily from food and supplements) and vitamin D3 (1000 IU daily) Zometa (5 mg IV annually) for osteopenia (T-score between -1.0 and -2.5) on ADT after dental clearance. If CRPC, 4 mg every 3 months if having bone metastases. Healthy lifestyle to prevent diabetes and CV disease Aggressive cardiovascular risk management Weight-bearing exercises (30 minutes per day) Limit alcohol consumption and avoid smoking Other secondary hypertension On valsartan -HCTZ.  Orders Placed This Encounter  Procedures   CBC with Differential (Cancer Center Only)    Standing Status:   Future    Expiration Date:   09/28/2025   CMP (Cancer Center only)    Standing Status:   Future     Expiration Date:   09/28/2025   PSA    Standing Status:   Future    Expiration Date:   09/28/2025   Testosterone    Standing Status:   Future    Expiration Date:   09/28/2025   All questions were answered. The patient knows to call the clinic with any problems, questions or concerns. No barriers to learning was detected.  Pauletta JAYSON Chihuahua, MD 11/17/20252:01 PM  CHIEF COMPLAINTS/PURPOSE OF CONSULTATION:  Prostate cancer  HISTORY OF PRESENTING ILLNESS:  Dan Farrell 63 y.o. male is here because of prostate cancer.  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  I have reviewed his chart and materials related to his cancer extensively and collaborated history with the patient. Summary of oncologic history is as follows: Oncology History  Prostate cancer (HCC)  03/24/2018 Initial Biopsy   he was diagnosed with Gleason 4+4 prostate cancer by Dr. Cam with a PSA of 11.    04/14/2018 Imaging   CT pelvis and bone scan revealed two suspicious iliac lymph nodes and evidence of osseous metastatic disease involving left pubic ramus, left femur, and possibly three ribs, T6 vertebra, and left scapula.    05/19/2018 Treatment Plan Change   Eligard  and Zytiga  with prednisone , Xgeva  added 10/02/2018   07/31/2018 - 09/22/2018 Radiation Therapy   concurrent radiation: The prostate was treated to an initial dose of 45 Gy in 25 fractions of 1.8 Gy, followed by a boost of 30 Gy in 15 fractions of  2 Gy, for a total of 75 Gy.    12/09/2023 Cancer Staging   Staging form: Prostate, AJCC 8th Edition - Clinical: Stage IVB (cTX, cNX, cM1) - Signed by Loretha Ash, MD on 12/09/2023     MEDICAL HISTORY:  Past Medical History:  Diagnosis Date   Hyperlipemia    Hypertension    Prostate cancer (HCC) 03/24/2018   prostate cancer with bony mets    SURGICAL HISTORY: Past Surgical History:  Procedure Laterality Date   PROSTATE BIOPSY     TONSILLECTOMY       SOCIAL HISTORY: Social History   Socioeconomic History   Marital status: Married    Spouse name: Not on file   Number of children: 2   Years of education: Not on file   Highest education level: Not on file  Occupational History   Not on file  Tobacco Use   Smoking status: Every Day    Current packs/day: 0.50    Average packs/day: 0.5 packs/day for 36.0 years (18.0 ttl pk-yrs)    Types: Cigarettes   Smokeless tobacco: Never  Vaping Use   Vaping status: Never Used  Substance and Sexual Activity   Alcohol use: Never   Drug use: Never   Sexual activity: Not Currently  Other Topics Concern   Not on file  Social History Narrative   Marred with one son and one daughter.    Social Drivers of Corporate Investment Banker Strain: Not on file  Food Insecurity: Patient Declined (12/03/2022)   Hunger Vital Sign    Worried About Running Out of Food in the Last Year: Patient declined    Ran Out of Food in the Last Year: Patient declined  Transportation Needs: Patient Declined (12/03/2022)   PRAPARE - Administrator, Civil Service (Medical): Patient declined    Lack of Transportation (Non-Medical): Patient declined  Physical Activity: Not on file  Stress: Not on file  Social Connections: Not on file  Intimate Partner Violence: Patient Declined (12/03/2022)   Humiliation, Afraid, Rape, and Kick questionnaire    Fear of Current or Ex-Partner: Patient declined    Emotionally Abused: Patient declined    Physically Abused: Patient declined    Sexually Abused: Patient declined    FAMILY HISTORY: Family History  Problem Relation Age of Onset   Seizures Brother    Aneurysm Brother    Stroke Brother     ALLERGIES:  is allergic to lexapro [escitalopram], zoloft [sertraline], and lisinopril.  MEDICATIONS:  Current Outpatient Medications  Medication Sig Dispense Refill   abiraterone  acetate (ABIRTEGA ) 250 MG tablet Take 4 tablets (1,000 mg total) by mouth daily. Take  on an empty stomach 1 hour before or 2 hours after a meal 120 tablet 6   Calcium  Carb-Cholecalciferol (OS-CAL CALCIUM  + D3) 500-200 MG-UNIT TABS Os-Cal + D3     celecoxib  (CELEBREX ) 50 MG capsule Take 1 capsule (50 mg total) by mouth daily. 4 capsule 0   fenofibrate  160 MG tablet TAKE 1 TABLET BY MOUTH ONCE A DAY FOR CHOLESTEROL  3   leuprolide  (LUPRON  DEPOT, 51-MONTH,) 30 MG injection as directed Intramuscular     Multiple Vitamin (MULTI-VITAMIN DAILY PO) 1 tablet     Naproxen Sodium (ALEVE PO) Aleve     predniSONE  (DELTASONE ) 5 MG tablet TAKE 1 TABLET BY MOUTH EVERY DAY WITH BREAKFAST 90 tablet 3   valsartan -hydrochlorothiazide (DIOVAN -HCT) 160-12.5 MG tablet Take 1 tablet by mouth daily.  0   No current facility-administered medications  for this visit.    REVIEW OF SYSTEMS:   All relevant systems were reviewed with the patient and are negative.  PHYSICAL EXAMINATION: ECOG PERFORMANCE STATUS: 0  Vitals:   09/28/24 1325  BP: (!) 151/89  Pulse: 78  Resp: 13  Temp: 98.4 F (36.9 C)  SpO2: 95%   Filed Weights   09/28/24 1325  Weight: 175 lb 4.8 oz (79.5 kg)    GENERAL: alert, no distress and comfortable LYMPH:  no palpable lymphadenopathy in the cervical, axillary regions LUNGS: Effort normal, no respiratory distress.  Clear to auscultation bilaterally HEART: regular rate & rhythm and no lower extremity edema ABDOMEN: soft, non-tender and nondistended Musculoskeletal: no point tenderness  LABORATORY DATA:  I have reviewed the results of PSA.

## 2024-09-28 ENCOUNTER — Ambulatory Visit

## 2024-09-28 ENCOUNTER — Ambulatory Visit: Admitting: Hematology and Oncology

## 2024-09-28 ENCOUNTER — Other Ambulatory Visit

## 2024-09-28 ENCOUNTER — Inpatient Hospital Stay (HOSPITAL_BASED_OUTPATIENT_CLINIC_OR_DEPARTMENT_OTHER)

## 2024-09-28 ENCOUNTER — Inpatient Hospital Stay

## 2024-09-28 VITALS — BP 151/89 | HR 78 | Temp 98.4°F | Resp 13 | Wt 175.3 lb

## 2024-09-28 DIAGNOSIS — I10 Essential (primary) hypertension: Secondary | ICD-10-CM | POA: Insufficient documentation

## 2024-09-28 DIAGNOSIS — Z79899 Other long term (current) drug therapy: Secondary | ICD-10-CM | POA: Diagnosis not present

## 2024-09-28 DIAGNOSIS — Z9189 Other specified personal risk factors, not elsewhere classified: Secondary | ICD-10-CM | POA: Insufficient documentation

## 2024-09-28 DIAGNOSIS — I158 Other secondary hypertension: Secondary | ICD-10-CM | POA: Diagnosis not present

## 2024-09-28 DIAGNOSIS — C61 Malignant neoplasm of prostate: Secondary | ICD-10-CM | POA: Diagnosis not present

## 2024-09-28 DIAGNOSIS — Z923 Personal history of irradiation: Secondary | ICD-10-CM | POA: Insufficient documentation

## 2024-09-28 DIAGNOSIS — C7951 Secondary malignant neoplasm of bone: Secondary | ICD-10-CM | POA: Insufficient documentation

## 2024-09-28 LAB — CBC WITH DIFFERENTIAL (CANCER CENTER ONLY)
Abs Immature Granulocytes: 0.03 K/uL (ref 0.00–0.07)
Basophils Absolute: 0 K/uL (ref 0.0–0.1)
Basophils Relative: 1 %
Eosinophils Absolute: 0.1 K/uL (ref 0.0–0.5)
Eosinophils Relative: 2 %
HCT: 35 % — ABNORMAL LOW (ref 39.0–52.0)
Hemoglobin: 12.1 g/dL — ABNORMAL LOW (ref 13.0–17.0)
Immature Granulocytes: 1 %
Lymphocytes Relative: 18 %
Lymphs Abs: 0.9 K/uL (ref 0.7–4.0)
MCH: 31.9 pg (ref 26.0–34.0)
MCHC: 34.6 g/dL (ref 30.0–36.0)
MCV: 92.3 fL (ref 80.0–100.0)
Monocytes Absolute: 0.4 K/uL (ref 0.1–1.0)
Monocytes Relative: 8 %
Neutro Abs: 3.5 K/uL (ref 1.7–7.7)
Neutrophils Relative %: 70 %
Platelet Count: 267 K/uL (ref 150–400)
RBC: 3.79 MIL/uL — ABNORMAL LOW (ref 4.22–5.81)
RDW: 13.1 % (ref 11.5–15.5)
WBC Count: 4.9 K/uL (ref 4.0–10.5)
nRBC: 0 % (ref 0.0–0.2)

## 2024-09-28 LAB — CMP (CANCER CENTER ONLY)
ALT: 11 U/L (ref 0–44)
AST: 16 U/L (ref 15–41)
Albumin: 4.2 g/dL (ref 3.5–5.0)
Alkaline Phosphatase: 42 U/L (ref 38–126)
Anion gap: 8 (ref 5–15)
BUN: 17 mg/dL (ref 8–23)
CO2: 25 mmol/L (ref 22–32)
Calcium: 9.4 mg/dL (ref 8.9–10.3)
Chloride: 104 mmol/L (ref 98–111)
Creatinine: 0.96 mg/dL (ref 0.61–1.24)
GFR, Estimated: >60 mL/min (ref >60)
Glucose, Bld: 88 mg/dL (ref 70–99)
Potassium: 3.9 mmol/L (ref 3.5–5.1)
Sodium: 137 mmol/L (ref 135–145)
Total Bilirubin: 0.4 mg/dL (ref 0.0–1.2)
Total Protein: 7 g/dL (ref 6.5–8.1)

## 2024-09-28 LAB — PSA: Prostatic Specific Antigen: <0.02 ng/mL (ref 0.00–4.00)

## 2024-09-28 MED ORDER — LEUPROLIDE ACETATE (4 MONTH) 30 MG ~~LOC~~ KIT
30.0000 mg | PACK | Freq: Once | SUBCUTANEOUS | Status: AC
  Administered 2024-09-28: 30 mg via SUBCUTANEOUS
  Filled 2024-09-28: qty 30

## 2024-09-28 MED ORDER — DENOSUMAB 120 MG/1.7ML ~~LOC~~ SOLN
120.0000 mg | Freq: Once | SUBCUTANEOUS | Status: AC
  Administered 2024-09-28: 120 mg via SUBCUTANEOUS
  Filled 2024-09-28: qty 1.7

## 2024-09-28 NOTE — Assessment & Plan Note (Addendum)
 Continue treatment that has been working for him with Zytiga , Lupron , Xgeva , and Prednisone .

## 2024-09-28 NOTE — Assessment & Plan Note (Addendum)
 baseline bone mineral density study and then every 2 years calcium  (1000-1200 mg daily from food and supplements) and vitamin D3 (1000 IU daily) Zometa (5 mg IV annually) for osteopenia (T-score between -1.0 and -2.5) on ADT after dental clearance. If CRPC, 4 mg every 3 months if having bone metastases. Healthy lifestyle to prevent diabetes and CV disease Aggressive cardiovascular risk management Weight-bearing exercises (30 minutes per day) Limit alcohol consumption and avoid smoking

## 2024-09-28 NOTE — Assessment & Plan Note (Addendum)
 On valsartan -HCTZ.

## 2024-09-29 ENCOUNTER — Ambulatory Visit: Payer: Self-pay

## 2024-09-29 ENCOUNTER — Encounter: Payer: Self-pay | Admitting: Hematology and Oncology

## 2024-09-29 LAB — TESTOSTERONE: Testosterone: <3 ng/dL — ABNORMAL LOW (ref 264–916)

## 2024-09-30 ENCOUNTER — Encounter: Payer: Self-pay | Admitting: Hematology and Oncology

## 2024-09-30 NOTE — Telephone Encounter (Signed)
 Notified of message below

## 2024-09-30 NOTE — Telephone Encounter (Signed)
-----   Message from Pauletta JAYSON Chihuahua sent at 09/29/2024 11:13 PM EST ----- Savannah Morford please let him know good news. PSA remains undetectable. Thanks  ----- Message ----- From: Rebecka, Lab In [place] Sent: 09/28/2024   1:12 PM EST To: Pauletta JAYSON Chihuahua, MD

## 2024-10-12 ENCOUNTER — Encounter: Payer: Self-pay | Admitting: *Deleted

## 2024-12-02 ENCOUNTER — Telehealth: Payer: Self-pay

## 2024-12-02 NOTE — Telephone Encounter (Signed)
 Good Morning, this is courteous notification of patient cancelling Genetic Counseling Referral for Mon Dec 07 2024. Patient was called today due to potential inclement weather this weekend into Monday. We offered to change consultation to virtual/telephone visit on Mon Jan 26 and make a lab appt for another day. He stated  I only wanted to have it done because of my cancer and having a son. My son has went to a doctor for genetic testing and I don't need this at this time.   Thank you

## 2024-12-07 ENCOUNTER — Inpatient Hospital Stay: Admitting: Genetic Counselor

## 2024-12-07 ENCOUNTER — Inpatient Hospital Stay

## 2025-02-08 ENCOUNTER — Ambulatory Visit

## 2025-02-08 ENCOUNTER — Other Ambulatory Visit

## 2025-02-11 ENCOUNTER — Inpatient Hospital Stay
